# Patient Record
Sex: Female | Born: 1975 | Race: White | Hispanic: No | Marital: Single | Smoking: Current every day smoker
Health system: Southern US, Community
[De-identification: ages and names within clinical notes are randomized; demographics above are authoritative.]

## PROBLEM LIST (undated history)

## (undated) ENCOUNTER — Inpatient Hospital Stay (HOSPITAL_COMMUNITY): Payer: Self-pay

## (undated) DIAGNOSIS — K611 Rectal abscess: Secondary | ICD-10-CM

## (undated) DIAGNOSIS — K649 Unspecified hemorrhoids: Secondary | ICD-10-CM

## (undated) DIAGNOSIS — K645 Perianal venous thrombosis: Secondary | ICD-10-CM

## (undated) DIAGNOSIS — Z720 Tobacco use: Secondary | ICD-10-CM

## (undated) HISTORY — PX: TONSILLECTOMY: SUR1361

## (undated) HISTORY — PX: DILATION AND CURETTAGE OF UTERUS: SHX78

---

## 1998-09-19 ENCOUNTER — Inpatient Hospital Stay (HOSPITAL_COMMUNITY): Admission: EM | Admit: 1998-09-19 | Discharge: 1998-09-20 | Payer: Self-pay | Admitting: Emergency Medicine

## 1998-09-19 ENCOUNTER — Encounter: Payer: Self-pay | Admitting: Emergency Medicine

## 1998-09-24 ENCOUNTER — Inpatient Hospital Stay (HOSPITAL_COMMUNITY): Admission: EM | Admit: 1998-09-24 | Discharge: 1998-09-25 | Payer: Self-pay | Admitting: Family Medicine

## 1998-11-26 ENCOUNTER — Other Ambulatory Visit: Admission: RE | Admit: 1998-11-26 | Discharge: 1998-11-26 | Payer: Self-pay | Admitting: Family Medicine

## 1999-02-06 ENCOUNTER — Emergency Department (HOSPITAL_COMMUNITY): Admission: EM | Admit: 1999-02-06 | Discharge: 1999-02-07 | Payer: Self-pay | Admitting: *Deleted

## 1999-09-23 ENCOUNTER — Other Ambulatory Visit: Admission: RE | Admit: 1999-09-23 | Discharge: 1999-09-23 | Payer: Self-pay | Admitting: Obstetrics and Gynecology

## 2000-12-09 ENCOUNTER — Emergency Department (HOSPITAL_COMMUNITY): Admission: EM | Admit: 2000-12-09 | Discharge: 2000-12-10 | Payer: Self-pay | Admitting: Emergency Medicine

## 2000-12-10 ENCOUNTER — Encounter: Payer: Self-pay | Admitting: Emergency Medicine

## 2001-01-01 ENCOUNTER — Other Ambulatory Visit: Admission: RE | Admit: 2001-01-01 | Discharge: 2001-01-01 | Payer: Self-pay | Admitting: Obstetrics and Gynecology

## 2001-12-18 ENCOUNTER — Encounter: Payer: Self-pay | Admitting: Emergency Medicine

## 2001-12-19 ENCOUNTER — Observation Stay (HOSPITAL_COMMUNITY): Admission: EM | Admit: 2001-12-19 | Discharge: 2001-12-20 | Payer: Self-pay | Admitting: Emergency Medicine

## 2001-12-19 ENCOUNTER — Encounter: Payer: Self-pay | Admitting: Emergency Medicine

## 2002-01-16 ENCOUNTER — Other Ambulatory Visit: Admission: RE | Admit: 2002-01-16 | Discharge: 2002-01-16 | Payer: Self-pay | Admitting: Obstetrics and Gynecology

## 2002-01-25 ENCOUNTER — Ambulatory Visit (HOSPITAL_COMMUNITY): Admission: RE | Admit: 2002-01-25 | Discharge: 2002-01-25 | Payer: Self-pay | Admitting: Obstetrics and Gynecology

## 2002-01-25 ENCOUNTER — Encounter: Payer: Self-pay | Admitting: Obstetrics and Gynecology

## 2002-02-20 ENCOUNTER — Inpatient Hospital Stay (HOSPITAL_COMMUNITY): Admission: AD | Admit: 2002-02-20 | Discharge: 2002-02-20 | Payer: Self-pay | Admitting: Obstetrics and Gynecology

## 2003-04-30 ENCOUNTER — Other Ambulatory Visit: Admission: RE | Admit: 2003-04-30 | Discharge: 2003-04-30 | Payer: Self-pay | Admitting: Obstetrics and Gynecology

## 2003-08-12 ENCOUNTER — Inpatient Hospital Stay (HOSPITAL_COMMUNITY): Admission: AD | Admit: 2003-08-12 | Discharge: 2003-08-23 | Payer: Self-pay | Admitting: Obstetrics and Gynecology

## 2003-10-30 ENCOUNTER — Inpatient Hospital Stay (HOSPITAL_COMMUNITY): Admission: AD | Admit: 2003-10-30 | Discharge: 2003-11-01 | Payer: Self-pay | Admitting: Obstetrics and Gynecology

## 2003-11-17 ENCOUNTER — Emergency Department (HOSPITAL_COMMUNITY): Admission: EM | Admit: 2003-11-17 | Discharge: 2003-11-17 | Payer: Self-pay | Admitting: Emergency Medicine

## 2005-07-18 ENCOUNTER — Ambulatory Visit: Payer: Self-pay | Admitting: Family Medicine

## 2005-08-05 ENCOUNTER — Ambulatory Visit: Payer: Self-pay | Admitting: Family Medicine

## 2006-02-02 ENCOUNTER — Other Ambulatory Visit: Admission: RE | Admit: 2006-02-02 | Discharge: 2006-02-02 | Payer: Self-pay | Admitting: Obstetrics and Gynecology

## 2010-06-06 ENCOUNTER — Encounter: Payer: Self-pay | Admitting: Obstetrics and Gynecology

## 2010-07-25 ENCOUNTER — Inpatient Hospital Stay (INDEPENDENT_AMBULATORY_CARE_PROVIDER_SITE_OTHER)
Admission: RE | Admit: 2010-07-25 | Discharge: 2010-07-25 | Disposition: A | Discharge status: Home / Self Care | Payer: Medicaid Other | Source: Ambulatory Visit | Attending: Family Medicine | Admitting: Family Medicine

## 2010-07-25 DIAGNOSIS — M67919 Unspecified disorder of synovium and tendon, unspecified shoulder: Secondary | ICD-10-CM

## 2010-12-24 ENCOUNTER — Inpatient Hospital Stay (INDEPENDENT_AMBULATORY_CARE_PROVIDER_SITE_OTHER)
Admission: RE | Admit: 2010-12-24 | Discharge: 2010-12-24 | Disposition: A | Discharge status: Home / Self Care | Payer: Medicaid Other | Source: Ambulatory Visit | Attending: Emergency Medicine | Admitting: Emergency Medicine

## 2010-12-24 DIAGNOSIS — L089 Local infection of the skin and subcutaneous tissue, unspecified: Secondary | ICD-10-CM

## 2010-12-24 DIAGNOSIS — L98499 Non-pressure chronic ulcer of skin of other sites with unspecified severity: Secondary | ICD-10-CM

## 2010-12-24 DIAGNOSIS — IMO0001 Reserved for inherently not codable concepts without codable children: Secondary | ICD-10-CM

## 2014-06-16 ENCOUNTER — Emergency Department (HOSPITAL_COMMUNITY)
Admission: EM | Admit: 2014-06-16 | Discharge: 2014-06-16 | Discharge status: Against Medical Advice | Payer: 59 | Attending: Emergency Medicine | Admitting: Emergency Medicine

## 2014-06-16 ENCOUNTER — Ambulatory Visit: Payer: Self-pay

## 2014-06-16 ENCOUNTER — Encounter (HOSPITAL_COMMUNITY): Payer: Self-pay

## 2014-06-16 DIAGNOSIS — M542 Cervicalgia: Secondary | ICD-10-CM | POA: Diagnosis not present

## 2014-06-16 DIAGNOSIS — Z720 Tobacco use: Secondary | ICD-10-CM | POA: Diagnosis not present

## 2014-06-16 DIAGNOSIS — M549 Dorsalgia, unspecified: Secondary | ICD-10-CM | POA: Diagnosis not present

## 2014-06-16 NOTE — ED Notes (Signed)
Patient reports that she was unable to get out of bed due to pain in neck and back.  She reports that she is medicated herself at home with Robaxin and Naproxen, which helped her to move, but still rates pain 8/10.

## 2014-08-19 ENCOUNTER — Encounter (HOSPITAL_COMMUNITY): Payer: Self-pay | Admitting: *Deleted

## 2014-08-19 ENCOUNTER — Inpatient Hospital Stay (HOSPITAL_COMMUNITY): Payer: 59

## 2014-08-19 ENCOUNTER — Inpatient Hospital Stay (HOSPITAL_COMMUNITY)
Admission: AD | Admit: 2014-08-19 | Discharge: 2014-08-19 | Disposition: A | Discharge status: Home / Self Care | Payer: 59 | Source: Ambulatory Visit | Attending: Family Medicine | Admitting: Family Medicine

## 2014-08-19 DIAGNOSIS — O208 Other hemorrhage in early pregnancy: Secondary | ICD-10-CM | POA: Diagnosis not present

## 2014-08-19 DIAGNOSIS — O9989 Other specified diseases and conditions complicating pregnancy, childbirth and the puerperium: Secondary | ICD-10-CM | POA: Diagnosis not present

## 2014-08-19 DIAGNOSIS — R109 Unspecified abdominal pain: Secondary | ICD-10-CM

## 2014-08-19 DIAGNOSIS — R55 Syncope and collapse: Secondary | ICD-10-CM | POA: Insufficient documentation

## 2014-08-19 DIAGNOSIS — Z3A01 Less than 8 weeks gestation of pregnancy: Secondary | ICD-10-CM | POA: Insufficient documentation

## 2014-08-19 DIAGNOSIS — O26899 Other specified pregnancy related conditions, unspecified trimester: Secondary | ICD-10-CM

## 2014-08-19 LAB — URINALYSIS, ROUTINE W REFLEX MICROSCOPIC
BILIRUBIN URINE: NEGATIVE
Glucose, UA: NEGATIVE mg/dL
Hgb urine dipstick: NEGATIVE
Ketones, ur: NEGATIVE mg/dL
Leukocytes, UA: NEGATIVE
Nitrite: NEGATIVE
Protein, ur: NEGATIVE mg/dL
Urobilinogen, UA: 0.2 mg/dL (ref 0.0–1.0)
pH: 5.5 (ref 5.0–8.0)

## 2014-08-19 LAB — CBC
HCT: 33.9 % — ABNORMAL LOW (ref 36.0–46.0)
HEMOGLOBIN: 11.3 g/dL — AB (ref 12.0–15.0)
MCH: 26.7 pg (ref 26.0–34.0)
MCHC: 33.3 g/dL (ref 30.0–36.0)
MCV: 80.1 fL (ref 78.0–100.0)
Platelets: 381 10*3/uL (ref 150–400)
RBC: 4.23 MIL/uL (ref 3.87–5.11)
RDW: 17.6 % — AB (ref 11.5–15.5)
WBC: 10.7 10*3/uL — ABNORMAL HIGH (ref 4.0–10.5)

## 2014-08-19 LAB — WET PREP, GENITAL
Clue Cells Wet Prep HPF POC: NONE SEEN
Trich, Wet Prep: NONE SEEN
Yeast Wet Prep HPF POC: NONE SEEN

## 2014-08-19 LAB — POCT PREGNANCY, URINE: Preg Test, Ur: POSITIVE — AB

## 2014-08-19 LAB — HCG, QUANTITATIVE, PREGNANCY: hCG, Beta Chain, Quant, S: 80124 m[IU]/mL — ABNORMAL HIGH (ref <5)

## 2014-08-19 MED ORDER — PROMETHAZINE HCL 25 MG PO TABS: 25.0000 mg | ORAL_TABLET | Freq: Four times a day (QID) | ORAL | Status: DC | PRN

## 2014-08-19 NOTE — Discharge Instructions (Signed)
First Trimester of Pregnancy The first trimester of pregnancy is from week 1 until the end of week 12 (months 1 through 3). A week after a sperm fertilizes an egg, the egg will implant on the wall of the uterus. This embryo will begin to develop into a baby. Genes from you and your partner are forming the baby. The female genes determine whether the baby is a boy or a girl. At 6-8 weeks, the eyes and face are formed, and the heartbeat can be seen on ultrasound. At the end of 12 weeks, all the baby's organs are formed.  Now that you are pregnant, you will want to do everything you can to have a healthy baby. Two of the most important things are to get good prenatal care and to follow your health care provider's instructions. Prenatal care is all the medical care you receive before the baby's birth. This care will help prevent, find, and treat any problems during the pregnancy and childbirth. BODY CHANGES Your body goes through many changes during pregnancy. The changes vary from woman to woman.   You may gain or lose a couple of pounds at first.  You may feel sick to your stomach (nauseous) and throw up (vomit). If the vomiting is uncontrollable, call your health care provider.  You may tire easily.  You may develop headaches that can be relieved by medicines approved by your health care provider.  You may urinate more often. Painful urination may mean you have a bladder infection.  You may develop heartburn as a result of your pregnancy.  You may develop constipation because certain hormones are causing the muscles that push waste through your intestines to slow down.  You may develop hemorrhoids or swollen, bulging veins (varicose veins).  Your breasts may begin to grow larger and become tender. Your nipples may stick out more, and the tissue that surrounds them (areola) may become darker.  Your gums may bleed and may be sensitive to brushing and flossing.  Dark spots or blotches (chloasma,  mask of pregnancy) may develop on your face. This will likely fade after the baby is born.  Your menstrual periods will stop.  You may have a loss of appetite.  You may develop cravings for certain kinds of food.  You may have changes in your emotions from day to day, such as being excited to be pregnant or being concerned that something may go wrong with the pregnancy and baby.  You may have more vivid and strange dreams.  You may have changes in your hair. These can include thickening of your hair, rapid growth, and changes in texture. Some women also have hair loss during or after pregnancy, or hair that feels dry or thin. Your hair will most likely return to normal after your baby is born. WHAT TO EXPECT AT YOUR PRENATAL VISITS During a routine prenatal visit:  You will be weighed to make sure you and the baby are growing normally.  Your blood pressure will be taken.  Your abdomen will be measured to track your baby's growth.  The fetal heartbeat will be listened to starting around week 10 or 12 of your pregnancy.  Test results from any previous visits will be discussed. Your health care provider may ask you:  How you are feeling.  If you are feeling the baby move.  If you have had any abnormal symptoms, such as leaking fluid, bleeding, severe headaches, or abdominal cramping.  If you have any questions. Other tests   that may be performed during your first trimester include:  Blood tests to find your blood type and to check for the presence of any previous infections. They will also be used to check for low iron levels (anemia) and Rh antibodies. Later in the pregnancy, blood tests for diabetes will be done along with other tests if problems develop.  Urine tests to check for infections, diabetes, or protein in the urine.  An ultrasound to confirm the proper growth and development of the baby.  An amniocentesis to check for possible genetic problems.  Fetal screens for  spina bifida and Down syndrome.  You may need other tests to make sure you and the baby are doing well. HOME CARE INSTRUCTIONS  Medicines  Follow your health care provider's instructions regarding medicine use. Specific medicines may be either safe or unsafe to take during pregnancy.  Take your prenatal vitamins as directed.  If you develop constipation, try taking a stool softener if your health care provider approves. Diet  Eat regular, well-balanced meals. Choose a variety of foods, such as meat or vegetable-based protein, fish, milk and low-fat dairy products, vegetables, fruits, and whole grain breads and cereals. Your health care provider will help you determine the amount of weight gain that is right for you.  Avoid raw meat and uncooked cheese. These carry germs that can cause birth defects in the baby.  Eating four or five small meals rather than three large meals a day may help relieve nausea and vomiting. If you start to feel nauseous, eating a few soda crackers can be helpful. Drinking liquids between meals instead of during meals also seems to help nausea and vomiting.  If you develop constipation, eat more high-fiber foods, such as fresh vegetables or fruit and whole grains. Drink enough fluids to keep your urine clear or pale yellow. Activity and Exercise  Exercise only as directed by your health care provider. Exercising will help you:  Control your weight.  Stay in shape.  Be prepared for labor and delivery.  Experiencing pain or cramping in the lower abdomen or low back is a good sign that you should stop exercising. Check with your health care provider before continuing normal exercises.  Try to avoid standing for long periods of time. Move your legs often if you must stand in one place for a long time.  Avoid heavy lifting.  Wear low-heeled shoes, and practice good posture.  You may continue to have sex unless your health care provider directs you  otherwise. Relief of Pain or Discomfort  Wear a good support bra for breast tenderness.   Take warm sitz baths to soothe any pain or discomfort caused by hemorrhoids. Use hemorrhoid cream if your health care provider approves.   Rest with your legs elevated if you have leg cramps or low back pain.  If you develop varicose veins in your legs, wear support hose. Elevate your feet for 15 minutes, 3-4 times a day. Limit salt in your diet. Prenatal Care  Schedule your prenatal visits by the twelfth week of pregnancy. They are usually scheduled monthly at first, then more often in the last 2 months before delivery.  Write down your questions. Take them to your prenatal visits.  Keep all your prenatal visits as directed by your health care provider. Safety  Wear your seat belt at all times when driving.  Make a list of emergency phone numbers, including numbers for family, friends, the hospital, and police and fire departments. General Tips    Ask your health care provider for a referral to a local prenatal education class. Begin classes no later than at the beginning of month 6 of your pregnancy.  Ask for help if you have counseling or nutritional needs during pregnancy. Your health care provider can offer advice or refer you to specialists for help with various needs.  Do not use hot tubs, steam rooms, or saunas.  Do not douche or use tampons or scented sanitary pads.  Do not cross your legs for long periods of time.  Avoid cat litter boxes and soil used by cats. These carry germs that can cause birth defects in the baby and possibly loss of the fetus by miscarriage or stillbirth.  Avoid all smoking, herbs, alcohol, and medicines not prescribed by your health care provider. Chemicals in these affect the formation and growth of the baby.  Schedule a dentist appointment. At home, brush your teeth with a soft toothbrush and be gentle when you floss. SEEK MEDICAL CARE IF:   You have  dizziness.  You have mild pelvic cramps, pelvic pressure, or nagging pain in the abdominal area.  You have persistent nausea, vomiting, or diarrhea.  You have a bad smelling vaginal discharge.  You have pain with urination.  You notice increased swelling in your face, hands, legs, or ankles. SEEK IMMEDIATE MEDICAL CARE IF:   You have a fever.  You are leaking fluid from your vagina.  You have spotting or bleeding from your vagina.  You have severe abdominal cramping or pain.  You have rapid weight gain or loss.  You vomit blood or material that looks like coffee grounds.  You are exposed to German measles and have never had them.  You are exposed to fifth disease or chickenpox.  You develop a severe headache.  You have shortness of breath.  You have any kind of trauma, such as from a fall or a car accident. Document Released: 04/26/2001 Document Revised: 09/16/2013 Document Reviewed: 03/12/2013 Exit[employer] Patient Information 2015 [employer]. This information is not intended to replace advice given to you by your health care provider. Make sure you discuss any questions you have with your health care provider.  

## 2014-08-19 NOTE — MAU Note (Signed)
Pos HPT 2 weeks ago, lower abd cramping for 4 weeks, denies bleeding.  Nausea & vomiting for the past week, no diarrhea.  Pt states she passed out this morning, woke up in the floor.  Pt has not eaten today, has drank water & coffee this morning which stayed down.

## 2014-08-19 NOTE — MAU Provider Note (Signed)
Chief Complaint: Loss of Consciousness and Abdominal Pain   First Provider Initiated Contact with Patient 08/19/14 1218     SUBJECTIVE HPI: Whitney [name] is a 39 y.o. [id number] at [id number] by LMP who presents [facility] reporting a one month history of cramping and a syncopal episode this morning. She notes that the cramping has been going on consistently for the past month and describes it as a shooting pain in the bilateral pelvic area. She ranks the pain as a 5/10 that sometimes dissipates quickly and sometimes lasts longer as a cramping sensation. She compared the pain to ovulation pain, and stated that she is not taking anything to relieve the pain. She denies any vaginal bleeding or discharge.  Additionally, this morning she had a syncopal episode, +loss of consciousness. She denies any trauma due to falling on an air mattress in the room. She has had syncopal episodes before due to dehydration. She denies having eaten anything today due to nausea, and admits that she has only had a bit of water to drink today. 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  History reviewed. No pertinent past medical history. OB History  Gravida Para Term Preterm AB SAB TAB Ectopic Multiple Living  4 1   2 2    1     # Outcome Date GA Lbr Len/2nd Weight Sex Delivery Anes PTL Lv  4 Current           3 SAB           2 SAB           1 Para              Past Surgical History  Procedure Laterality Date  . Dilation and curettage of uterus    . Tonsillectomy     History   Social History  . Marital Status: Single    Spouse Name: N/A  . Number of Children: N/A  . Years of Education: N/A   Occupational History  . Not on file.   Social History Main Topics  . Smoking status: Current Every Day Smoker -- 1.00 packs/day  . Smokeless tobacco: Not on file  . Alcohol Use: No     Comment: occasionally  . Drug Use: No  . Sexual Activity: Yes    Birth Control/  Protection: None   Other Topics Concern  . Not on file   Social History Narrative   No current facility-administered medications on file prior to encounter.   No current outpatient prescriptions on file prior to encounter.   No Known Allergies  Review of Systems  Constitutional: Negative for fever, chills and malaise/fatigue.  Gastrointestinal: Positive for abdominal pain. Negative for nausea, vomiting, diarrhea and constipation.  Genitourinary: Negative for dysuria.  Neurological: Positive for dizziness (with fainting episodes) and loss of consciousness. Negative for seizures, weakness and headaches.    OBJECTIVE Blood pressure 111/49, pulse 84, temperature 97.9 F (36.6 C), temperature source Oral, resp. rate 18, height 5\' 6"  (1.676 m), weight 68.402 kg (150 lb 12.8 oz), last menstrual period 07/05/2014. GENERAL: Well-developed, well-nourished female in no acute distress.  HEART: RRR, normal S1/S2, no m/r/g RESP: normal effort, CTAB GI: Abdomen soft, non-tender. Positive bowel sounds 4. MS: Nontender, no edema NEURO: Alert and oriented SPECULUM EXAM: NEFG, physiologic discharge, no blood noted, cervix clean BIMANUAL: cervix closed and long; uterus normal size, no adnexal tenderness or masses  LAB RESULTS Results for orders placed or  performed during the hospital encounter of 08/19/14 (from the past 24 hour(s))  Urinalysis, Routine w reflex microscopic     Status: Abnormal   Collection Time: 08/19/14 11:00 AM  Result Value Ref Range   Color, Urine YELLOW YELLOW   APPearance CLEAR CLEAR   Specific Gravity, Urine >1.030 (H) 1.005 - 1.030   pH 5.5 5.0 - 8.0   Glucose, UA NEGATIVE NEGATIVE mg/dL   Hgb urine dipstick NEGATIVE NEGATIVE   Bilirubin Urine NEGATIVE NEGATIVE   Ketones, ur NEGATIVE NEGATIVE mg/dL   Protein, ur NEGATIVE NEGATIVE mg/dL   Urobilinogen, UA 0.2 0.0 - 1.0 mg/dL   Nitrite NEGATIVE NEGATIVE   Leukocytes, UA NEGATIVE NEGATIVE  Pregnancy, urine POC      Status: Abnormal   Collection Time: 08/19/14 11:12 AM  Result Value Ref Range   Preg Test, Ur POSITIVE (A) NEGATIVE  CBC     Status: Abnormal   Collection Time: 08/19/14 12:30 PM  Result Value Ref Range   WBC 10.7 (H) 4.0 - 10.5 K/uL   RBC 4.23 3.87 - 5.11 MIL/uL   Hemoglobin 11.3 (L) 12.0 - 15.0 g/dL   HCT 40.9 (L) 81.1 - 91.4 %   MCV 80.1 78.0 - 100.0 fL   MCH 26.7 26.0 - 34.0 pg   MCHC 33.3 30.0 - 36.0 g/dL   RDW 78.2 (H) 95.6 - 21.3 %   Platelets 381 150 - 400 K/uL    IMAGING No results found.  MAU COURSE Urine preg test CBC U/A G/C and HIV Ultrasound   ASSESSMENT No diagnosis found.  PLAN Evaluate for possible ectopic     Medication List    Notice    You have not been prescribed any medications.     Results for TRIVIA, HEFFELFINGER (MRN [id number]) as of 08/20/2014 17:41  Ref. Range 08/19/2014 12:29  hCG, Beta Chain, Quant, S Latest Range: <5 mIU/mL 80124 (H)   Results for ARLY, SALMINEN (MRN [id number]) as of 08/20/2014 17:41  Ref. Range 08/19/2014 12:40  Yeast Wet Prep HPF POC Latest Range: NONE SEEN  NONE SEEN  Trich, Wet Prep Latest Range: NONE SEEN  NONE SEEN  Clue Cells Wet Prep HPF POC Latest Range: NONE SEEN  NONE SEEN  WBC, Wet Prep HPF POC Latest Range: NONE SEEN  FEW (A)   FINDINGS: Intrauterine gestational sac: Single  Yolk sac: Yes  Embryo: Yes  Cardiac Activity: Yes  Heart Rate: 118 bpm  MSD: mm w d  CRL: 6.3 Mm 6 w 4 d [place] EDC: 04/10/2015  Maternal uterus/adnexae:  Subchorionic hemorrhage: Small  Right ovary: Normal  Left ovary: Normal  Other :None  Free fluid: None  IMPRESSION: 1. Single living intrauterine gestation. The estimated gestational age is 6 weeks and 4 days. 2. Small subchorionic hemorrhage.  A:  SIUP at [redacted]w[redacted]d        No ectopic       Syncope, probably related to early pregnancy and poor intake       Small subchorionic hemorrhage  P:  Discussed findings with patient.  Discussed small SCH usually resolves over time, rarely progresses to abruption       Recommend she start prenatal care soon to follow pregnancy

## 2014-08-20 LAB — GC/CHLAMYDIA PROBE AMP (~~LOC~~) NOT AT ARMC
Chlamydia: NEGATIVE
Neisseria Gonorrhea: NEGATIVE

## 2014-08-20 LAB — HIV ANTIBODY (ROUTINE TESTING W REFLEX): HIV Screen 4th Generation wRfx: NONREACTIVE

## 2014-08-25 ENCOUNTER — Inpatient Hospital Stay (HOSPITAL_COMMUNITY): Payer: 59

## 2014-08-25 ENCOUNTER — Inpatient Hospital Stay (HOSPITAL_COMMUNITY)
Admission: AD | Admit: 2014-08-25 | Discharge: 2014-08-25 | Disposition: A | Discharge status: Home / Self Care | Payer: 59 | Source: Ambulatory Visit | Attending: Obstetrics and Gynecology | Admitting: Obstetrics and Gynecology

## 2014-08-25 DIAGNOSIS — Z3A01 Less than 8 weeks gestation of pregnancy: Secondary | ICD-10-CM | POA: Diagnosis not present

## 2014-08-25 DIAGNOSIS — O4691 Antepartum hemorrhage, unspecified, first trimester: Secondary | ICD-10-CM

## 2014-08-25 DIAGNOSIS — F17200 Nicotine dependence, unspecified, uncomplicated: Secondary | ICD-10-CM | POA: Diagnosis not present

## 2014-08-25 DIAGNOSIS — O209 Hemorrhage in early pregnancy, unspecified: Secondary | ICD-10-CM | POA: Insufficient documentation

## 2014-08-25 DIAGNOSIS — O99331 Smoking (tobacco) complicating pregnancy, first trimester: Secondary | ICD-10-CM | POA: Insufficient documentation

## 2014-08-25 LAB — URINALYSIS, ROUTINE W REFLEX MICROSCOPIC
BILIRUBIN URINE: NEGATIVE
GLUCOSE, UA: NEGATIVE mg/dL
KETONES UR: NEGATIVE mg/dL
LEUKOCYTES UA: NEGATIVE
Nitrite: NEGATIVE
Protein, ur: NEGATIVE mg/dL
Specific Gravity, Urine: 1.025 (ref 1.005–1.030)
Urobilinogen, UA: 0.2 mg/dL (ref 0.0–1.0)
pH: 6 (ref 5.0–8.0)

## 2014-08-25 LAB — ABO/RH: ABO/RH(D): A POS

## 2014-08-25 LAB — URINE MICROSCOPIC-ADD ON

## 2014-08-25 NOTE — MAU Note (Signed)
Pt dx'd with subchorionic hemorrhage last week, started having bleeding this morning after intercourse, passed golf ball size clot.  Continues to have spotting, lower abd cramping.

## 2014-08-25 NOTE — MAU Provider Note (Signed)
  History   G4P1 at 7.[redacted] wks gestation in with light bleeding now but earlier today had bleeding with clots. Was dx last week with sub chorionic bleed. Denies any pain now but is concerned she miscarried with all the clots earlier.  CSN: [id number]  Arrival date and time: 08/25/14 1713   None     Chief Complaint  Patient presents with  . Vaginal Bleeding  . Abdominal Pain   HPI  OB History    Gravida Para Term Preterm AB TAB SAB Ectopic Multiple Living   4 1   2  2   1       No past medical history on file.  Past Surgical History  Procedure Laterality Date  . Dilation and curettage of uterus    . Tonsillectomy      No family history on file.  History  Substance Use Topics  . Smoking status: Current Every Day Smoker -- 1.00 packs/day  . Smokeless tobacco: Not on file  . Alcohol Use: No     Comment: occasionally    Allergies: No Known Allergies  Prescriptions prior to admission  Medication Sig Dispense Refill Last Dose  . promethazine (PHENERGAN) 25 MG tablet Take 1 tablet (25 mg total) by mouth every 6 (six) hours as needed for nausea or vomiting. 30 tablet 2     Review of Systems  HENT: Negative.   Eyes: Negative.   Respiratory: Negative.   Cardiovascular: Negative.   Gastrointestinal: Negative.   Genitourinary: Negative.        Scant amt vag bleeding  Musculoskeletal: Negative.   Skin: Negative.   Neurological: Negative.   Endo/Heme/Allergies: Negative.   Psychiatric/Behavioral: Negative.    Physical Exam   Blood pressure 110/65, pulse 102, temperature 98 F (36.7 C), temperature source Oral, resp. rate 18, last menstrual period 07/05/2014.  Physical Exam  Constitutional: She is oriented to person, place, and time. She appears well-developed and well-nourished.  HENT:  Head: Normocephalic.  Neck: Normal range of motion.  Cardiovascular: Normal rate, regular rhythm, normal heart sounds and intact distal pulses.   Respiratory: Effort normal and  breath sounds normal.  GI: Soft. Bowel sounds are normal.  Genitourinary:  scant amt of vag bleeding  Musculoskeletal: Normal range of motion.  Neurological: She is alert and oriented to person, place, and time. She has normal reflexes.  Skin: Skin is warm and dry.  Psychiatric: She has a normal mood and affect. Her behavior is normal. Judgment and thought content normal.    MAU Course  Procedures  MDM Vag probe u/s and ABO and RH   Assessment and Plan  VSS, only bleeding when she wipes now, no cramping. Will get vag probe u/s and ABO and RH. U/S shows viable IUP. Will D/C home  Wyvonnia DuskyLAWSON, MARIE DARLENE 08/25/2014, 6:04 PM

## 2015-04-13 ENCOUNTER — Inpatient Hospital Stay (HOSPITAL_COMMUNITY): Payer: Medicaid Other

## 2015-04-13 ENCOUNTER — Observation Stay (HOSPITAL_COMMUNITY): Payer: Medicaid Other | Admitting: Anesthesiology

## 2015-04-13 ENCOUNTER — Encounter (HOSPITAL_COMMUNITY): Payer: Self-pay | Admitting: *Deleted

## 2015-04-13 ENCOUNTER — Observation Stay (HOSPITAL_COMMUNITY)
Admission: AD | Admit: 2015-04-13 | Discharge: 2015-04-14 | Disposition: A | Discharge status: Home / Self Care | Payer: Medicaid Other | Source: Ambulatory Visit | Attending: General Surgery | Admitting: General Surgery

## 2015-04-13 ENCOUNTER — Encounter (HOSPITAL_COMMUNITY)
Admission: AD | Disposition: A | Discharge status: Home / Self Care | Payer: Self-pay | Source: Ambulatory Visit | Attending: Emergency Medicine

## 2015-04-13 DIAGNOSIS — D72829 Elevated white blood cell count, unspecified: Secondary | ICD-10-CM

## 2015-04-13 DIAGNOSIS — K611 Rectal abscess: Secondary | ICD-10-CM | POA: Diagnosis not present

## 2015-04-13 DIAGNOSIS — F17210 Nicotine dependence, cigarettes, uncomplicated: Secondary | ICD-10-CM | POA: Insufficient documentation

## 2015-04-13 DIAGNOSIS — K610 Anal abscess: Secondary | ICD-10-CM

## 2015-04-13 DIAGNOSIS — K645 Perianal venous thrombosis: Principal | ICD-10-CM

## 2015-04-13 DIAGNOSIS — Z720 Tobacco use: Secondary | ICD-10-CM

## 2015-04-13 HISTORY — DX: Rectal abscess: K61.1

## 2015-04-13 HISTORY — PX: INCISION AND DRAINAGE PERIRECTAL ABSCESS: SHX1804

## 2015-04-13 HISTORY — DX: Tobacco use: Z72.0

## 2015-04-13 HISTORY — DX: Perianal venous thrombosis: K64.5

## 2015-04-13 HISTORY — DX: Unspecified hemorrhoids: K64.9

## 2015-04-13 LAB — COMPREHENSIVE METABOLIC PANEL
ALT: 13 U/L — AB (ref 14–54)
AST: 12 U/L — AB (ref 15–41)
Albumin: 3.8 g/dL (ref 3.5–5.0)
Alkaline Phosphatase: 47 U/L (ref 38–126)
Anion gap: 5 (ref 5–15)
BUN: 10 mg/dL (ref 6–20)
CHLORIDE: 108 mmol/L (ref 101–111)
CO2: 22 mmol/L (ref 22–32)
CREATININE: 0.52 mg/dL (ref 0.44–1.00)
Calcium: 9 mg/dL (ref 8.9–10.3)
GFR calc Af Amer: >60 mL/min (ref >60)
Glucose, Bld: 98 mg/dL (ref 65–99)
POTASSIUM: 4.6 mmol/L (ref 3.5–5.1)
SODIUM: 135 mmol/L (ref 135–145)
Total Bilirubin: 0.3 mg/dL (ref 0.3–1.2)
Total Protein: 6.6 g/dL (ref 6.5–8.1)

## 2015-04-13 LAB — WET PREP, GENITAL
SPERM: NONE SEEN
Trich, Wet Prep: NONE SEEN
Yeast Wet Prep HPF POC: NONE SEEN

## 2015-04-13 LAB — CBC
HEMATOCRIT: 34.6 % — AB (ref 36.0–46.0)
HEMOGLOBIN: 11.3 g/dL — AB (ref 12.0–15.0)
MCH: 27.5 pg (ref 26.0–34.0)
MCHC: 32.7 g/dL (ref 30.0–36.0)
MCV: 84.2 fL (ref 78.0–100.0)
Platelets: 395 10*3/uL (ref 150–400)
RBC: 4.11 MIL/uL (ref 3.87–5.11)
RDW: 16.5 % — ABNORMAL HIGH (ref 11.5–15.5)
WBC: 17.2 10*3/uL — ABNORMAL HIGH (ref 4.0–10.5)

## 2015-04-13 LAB — URINALYSIS, ROUTINE W REFLEX MICROSCOPIC
Bilirubin Urine: NEGATIVE
Glucose, UA: NEGATIVE mg/dL
HGB URINE DIPSTICK: NEGATIVE
Ketones, ur: NEGATIVE mg/dL
Leukocytes, UA: NEGATIVE
Nitrite: NEGATIVE
PH: 5.5 (ref 5.0–8.0)
Protein, ur: NEGATIVE mg/dL
SPECIFIC GRAVITY, URINE: 1.015 (ref 1.005–1.030)

## 2015-04-13 LAB — SURGICAL PCR SCREEN
MRSA, PCR: NEGATIVE
Staphylococcus aureus: NEGATIVE

## 2015-04-13 LAB — POCT PREGNANCY, URINE: Preg Test, Ur: NEGATIVE

## 2015-04-13 SURGERY — INCISION AND DRAINAGE, ABSCESS, PERIRECTAL
Anesthesia: General

## 2015-04-13 MED ORDER — DIPHENHYDRAMINE HCL 50 MG/ML IJ SOLN
12.5000 mg | Freq: Four times a day (QID) | INTRAMUSCULAR | Status: DC | PRN
  Administered 2015-04-14: 25 mg via INTRAVENOUS
  Filled 2015-04-13: qty 1

## 2015-04-13 MED ORDER — BUPIVACAINE-EPINEPHRINE 0.25% -1:200000 IJ SOLN
INTRAMUSCULAR | Status: AC
  Filled 2015-04-13: qty 1

## 2015-04-13 MED ORDER — ONDANSETRON 4 MG PO TBDP: 4.0000 mg | ORAL_TABLET | Freq: Four times a day (QID) | ORAL | Status: DC | PRN

## 2015-04-13 MED ORDER — BUPIVACAINE-EPINEPHRINE 0.25% -1:200000 IJ SOLN
INTRAMUSCULAR | Status: DC | PRN
  Administered 2015-04-13: 50 mL

## 2015-04-13 MED ORDER — DIBUCAINE 1 % RE OINT
TOPICAL_OINTMENT | RECTAL | Status: DC | PRN
  Administered 2015-04-13: 1 via RECTAL

## 2015-04-13 MED ORDER — PIPERACILLIN-TAZOBACTAM 3.375 G IVPB
INTRAVENOUS | Status: AC
  Filled 2015-04-13: qty 50

## 2015-04-13 MED ORDER — LIDOCAINE HCL (CARDIAC) 20 MG/ML IV SOLN
INTRAVENOUS | Status: DC | PRN
  Administered 2015-04-13: 30 mg via INTRAVENOUS

## 2015-04-13 MED ORDER — IOHEXOL 300 MG/ML  SOLN
50.0000 mL | Freq: Once | INTRAMUSCULAR | Status: AC | PRN
  Administered 2015-04-13: 50 mL via ORAL

## 2015-04-13 MED ORDER — DEXAMETHASONE SODIUM PHOSPHATE 10 MG/ML IJ SOLN
INTRAMUSCULAR | Status: AC
  Filled 2015-04-13: qty 1

## 2015-04-13 MED ORDER — IOHEXOL 300 MG/ML  SOLN
100.0000 mL | Freq: Once | INTRAMUSCULAR | Status: AC | PRN
  Administered 2015-04-13: 100 mL via INTRAVENOUS

## 2015-04-13 MED ORDER — MAGIC MOUTHWASH
15.0000 mL | Freq: Four times a day (QID) | ORAL | Status: DC | PRN
  Filled 2015-04-13: qty 15

## 2015-04-13 MED ORDER — PROMETHAZINE HCL 25 MG/ML IJ SOLN: 6.2500 mg | INTRAMUSCULAR | Status: DC | PRN

## 2015-04-13 MED ORDER — ONDANSETRON HCL 4 MG/2ML IJ SOLN: 4.0000 mg | Freq: Four times a day (QID) | INTRAMUSCULAR | Status: DC | PRN

## 2015-04-13 MED ORDER — ALUM & MAG HYDROXIDE-SIMETH 200-200-20 MG/5ML PO SUSP: 30.0000 mL | Freq: Four times a day (QID) | ORAL | Status: DC | PRN

## 2015-04-13 MED ORDER — ACETAMINOPHEN 500 MG PO TABS
1000.0000 mg | ORAL_TABLET | Freq: Three times a day (TID) | ORAL | Status: DC
  Administered 2015-04-13 – 2015-04-14 (×2): 1000 mg via ORAL
  Filled 2015-04-13 (×3): qty 2

## 2015-04-13 MED ORDER — SODIUM CHLORIDE 0.9 % IJ SOLN: 3.0000 mL | Freq: Two times a day (BID) | INTRAMUSCULAR | Status: DC

## 2015-04-13 MED ORDER — AMOXICILLIN-POT CLAVULANATE 875-125 MG PO TABS: 1.0000 | ORAL_TABLET | Freq: Two times a day (BID) | ORAL | Status: AC

## 2015-04-13 MED ORDER — LIDOCAINE HCL (CARDIAC) 20 MG/ML IV SOLN
INTRAVENOUS | Status: AC
  Filled 2015-04-13: qty 5

## 2015-04-13 MED ORDER — PHENOL 1.4 % MT LIQD
2.0000 | OROMUCOSAL | Status: DC | PRN
  Filled 2015-04-13: qty 177

## 2015-04-13 MED ORDER — MORPHINE SULFATE (PF) 2 MG/ML IV SOLN
1.0000 mg | INTRAVENOUS | Status: DC | PRN
Start: 2015-04-13 — End: 2015-04-14
  Administered 2015-04-13: 2 mg via INTRAVENOUS
  Filled 2015-04-13: qty 1

## 2015-04-13 MED ORDER — ONDANSETRON HCL 4 MG/2ML IJ SOLN
INTRAMUSCULAR | Status: AC
  Filled 2015-04-13: qty 2

## 2015-04-13 MED ORDER — PROPOFOL 10 MG/ML IV BOLUS
INTRAVENOUS | Status: DC | PRN
  Administered 2015-04-13: 150 mg via INTRAVENOUS
  Administered 2015-04-13: 50 mg via INTRAVENOUS

## 2015-04-13 MED ORDER — LIP MEDEX EX OINT
1.0000 "application " | TOPICAL_OINTMENT | Freq: Two times a day (BID) | CUTANEOUS | Status: DC
  Administered 2015-04-13 – 2015-04-14 (×2): 1 via TOPICAL
  Filled 2015-04-13: qty 7

## 2015-04-13 MED ORDER — MENTHOL 3 MG MT LOZG
1.0000 | LOZENGE | OROMUCOSAL | Status: DC | PRN
  Filled 2015-04-13: qty 9

## 2015-04-13 MED ORDER — ONDANSETRON HCL 4 MG/2ML IJ SOLN
INTRAMUSCULAR | Status: DC | PRN
  Administered 2015-04-13: 4 mg via INTRAVENOUS

## 2015-04-13 MED ORDER — FENTANYL CITRATE (PF) 100 MCG/2ML IJ SOLN
INTRAMUSCULAR | Status: AC
  Filled 2015-04-13: qty 2

## 2015-04-13 MED ORDER — WITCH HAZEL-GLYCERIN EX PADS
1.0000 | MEDICATED_PAD | CUTANEOUS | Status: DC | PRN
Start: 2015-04-13 — End: 2015-04-14
  Filled 2015-04-13: qty 100

## 2015-04-13 MED ORDER — PROPOFOL 10 MG/ML IV BOLUS
INTRAVENOUS | Status: AC
  Filled 2015-04-13: qty 20

## 2015-04-13 MED ORDER — KETOROLAC TROMETHAMINE 60 MG/2ML IM SOLN
60.0000 mg | Freq: Once | INTRAMUSCULAR | Status: AC
  Administered 2015-04-13: 60 mg via INTRAMUSCULAR
  Filled 2015-04-13: qty 2

## 2015-04-13 MED ORDER — ONDANSETRON HCL 40 MG/20ML IJ SOLN
8.0000 mg | Freq: Four times a day (QID) | INTRAMUSCULAR | Status: DC | PRN
  Filled 2015-04-13: qty 4

## 2015-04-13 MED ORDER — LACTATED RINGERS IV BOLUS (SEPSIS): 1000.0000 mL | Freq: Three times a day (TID) | INTRAVENOUS | Status: DC | PRN

## 2015-04-13 MED ORDER — MIDAZOLAM HCL 5 MG/5ML IJ SOLN
INTRAMUSCULAR | Status: DC | PRN
  Administered 2015-04-13: 2 mg via INTRAVENOUS

## 2015-04-13 MED ORDER — MIDAZOLAM HCL 2 MG/2ML IJ SOLN
INTRAMUSCULAR | Status: AC
  Filled 2015-04-13: qty 2

## 2015-04-13 MED ORDER — NICOTINE 21 MG/24HR TD PT24
21.0000 mg | MEDICATED_PATCH | Freq: Every day | TRANSDERMAL | Status: DC
  Administered 2015-04-13: 21 mg via TRANSDERMAL
  Filled 2015-04-13 (×2): qty 1

## 2015-04-13 MED ORDER — SODIUM CHLORIDE 0.9 % IV SOLN: 250.0000 mL | INTRAVENOUS | Status: DC | PRN

## 2015-04-13 MED ORDER — ACETAMINOPHEN 325 MG PO TABS: 650.0000 mg | ORAL_TABLET | Freq: Four times a day (QID) | ORAL | Status: DC | PRN

## 2015-04-13 MED ORDER — FENTANYL CITRATE (PF) 100 MCG/2ML IJ SOLN
INTRAMUSCULAR | Status: DC | PRN
  Administered 2015-04-13 (×4): 50 ug via INTRAVENOUS

## 2015-04-13 MED ORDER — METOPROLOL TARTRATE 1 MG/ML IV SOLN
5.0000 mg | Freq: Four times a day (QID) | INTRAVENOUS | Status: DC | PRN
  Filled 2015-04-13: qty 5

## 2015-04-13 MED ORDER — OXYCODONE HCL 5 MG PO TABS
5.0000 mg | ORAL_TABLET | ORAL | Status: DC | PRN
  Administered 2015-04-13 – 2015-04-14 (×2): 10 mg via ORAL
  Filled 2015-04-13 (×2): qty 2

## 2015-04-13 MED ORDER — NAPROXEN 500 MG PO TABS: 500.0000 mg | ORAL_TABLET | Freq: Two times a day (BID) | ORAL | Status: AC

## 2015-04-13 MED ORDER — DEXAMETHASONE SODIUM PHOSPHATE 10 MG/ML IJ SOLN
INTRAMUSCULAR | Status: DC | PRN
  Administered 2015-04-13: 10 mg via INTRAVENOUS

## 2015-04-13 MED ORDER — LACTATED RINGERS IV SOLN
INTRAVENOUS | Status: DC | PRN
  Administered 2015-04-13: 19:00:00 via INTRAVENOUS

## 2015-04-13 MED ORDER — DIBUCAINE 1 % RE OINT
TOPICAL_OINTMENT | RECTAL | Status: AC
  Filled 2015-04-13: qty 28

## 2015-04-13 MED ORDER — ONDANSETRON HCL 4 MG/2ML IJ SOLN: 4.0000 mg | INTRAMUSCULAR | Status: DC | PRN

## 2015-04-13 MED ORDER — SODIUM CHLORIDE 0.9 % IJ SOLN: 3.0000 mL | INTRAMUSCULAR | Status: DC | PRN

## 2015-04-13 MED ORDER — OXYCODONE HCL 5 MG PO TABS: 5.0000 mg | ORAL_TABLET | Freq: Four times a day (QID) | ORAL | Status: DC | PRN

## 2015-04-13 MED ORDER — PIPERACILLIN-TAZOBACTAM 3.375 G IVPB
3.3750 g | Freq: Three times a day (TID) | INTRAVENOUS | Status: DC
  Administered 2015-04-13 – 2015-04-14 (×3): 3.375 g via INTRAVENOUS
  Filled 2015-04-13 (×4): qty 50

## 2015-04-13 MED ORDER — HYDROMORPHONE HCL 1 MG/ML IJ SOLN: 0.5000 mg | INTRAMUSCULAR | Status: DC | PRN

## 2015-04-13 MED ORDER — LACTATED RINGERS IV SOLN
INTRAVENOUS | Status: DC
  Administered 2015-04-14: 07:00:00 via INTRAVENOUS

## 2015-04-13 MED ORDER — HYDROCODONE-ACETAMINOPHEN 5-325 MG PO TABS
1.0000 | ORAL_TABLET | Freq: Once | ORAL | Status: AC
  Administered 2015-04-13: 1 via ORAL
  Filled 2015-04-13: qty 1

## 2015-04-13 MED ORDER — IBUPROFEN 200 MG PO TABS: 400.0000 mg | ORAL_TABLET | Freq: Four times a day (QID) | ORAL | Status: DC | PRN

## 2015-04-13 MED ORDER — METHOCARBAMOL 1000 MG/10ML IJ SOLN
1000.0000 mg | Freq: Four times a day (QID) | INTRAVENOUS | Status: DC | PRN
  Filled 2015-04-13: qty 10

## 2015-04-13 MED ORDER — LORAZEPAM 2 MG/ML IJ SOLN: 0.5000 mg | Freq: Four times a day (QID) | INTRAMUSCULAR | Status: DC | PRN

## 2015-04-13 MED ORDER — HYDROMORPHONE HCL 1 MG/ML IJ SOLN: 0.2500 mg | INTRAMUSCULAR | Status: DC | PRN

## 2015-04-13 SURGICAL SUPPLY — 28 items
BLADE HEX COATED 2.75 (ELECTRODE) ×3 IMPLANT
BLADE SURG 15 STRL LF DISP TIS (BLADE) ×1 IMPLANT
BLADE SURG 15 STRL SS (BLADE) ×3
BRIEF STRETCH FOR OB PAD LRG (UNDERPADS AND DIAPERS) ×3 IMPLANT
DECANTER SPIKE VIAL GLASS SM (MISCELLANEOUS) ×2 IMPLANT
DRAPE LAPAROTOMY T 102X78X121 (DRAPES) ×3 IMPLANT
DRSG PAD ABDOMINAL 8X10 ST (GAUZE/BANDAGES/DRESSINGS) ×3 IMPLANT
ELECT PENCIL ROCKER SW 15FT (MISCELLANEOUS) ×3 IMPLANT
ELECT REM PT RETURN 9FT ADLT (ELECTROSURGICAL) ×3
ELECTRODE REM PT RTRN 9FT ADLT (ELECTROSURGICAL) ×1 IMPLANT
GAUZE SPONGE 4X4 12PLY STRL (GAUZE/BANDAGES/DRESSINGS) ×3 IMPLANT
GAUZE SPONGE 4X4 16PLY XRAY LF (GAUZE/BANDAGES/DRESSINGS) ×3 IMPLANT
GLOVE ECLIPSE 8.0 STRL XLNG CF (GLOVE) ×3 IMPLANT
GLOVE INDICATOR 8.0 STRL GRN (GLOVE) ×3 IMPLANT
GOWN STRL REUS W/TWL XL LVL3 (GOWN DISPOSABLE) ×6 IMPLANT
KIT BASIN OR (CUSTOM PROCEDURE TRAY) ×3 IMPLANT
LUBRICANT JELLY K Y 4OZ (MISCELLANEOUS) ×3 IMPLANT
NDL SAFETY ECLIPSE 18X1.5 (NEEDLE) IMPLANT
NEEDLE HYPO 18GX1.5 SHARP (NEEDLE) ×3
NEEDLE HYPO 22GX1.5 SAFETY (NEEDLE) ×3 IMPLANT
PACK BASIC VI WITH GOWN DISP (CUSTOM PROCEDURE TRAY) ×3 IMPLANT
SPONGE LAP 18X18 X RAY DECT (DISPOSABLE) ×1 IMPLANT
SUT CHROMIC 3 0 SH 27 (SUTURE) ×2 IMPLANT
SYR 20CC LL (SYRINGE) ×5 IMPLANT
SYR BULB IRRIGATION 50ML (SYRINGE) ×3 IMPLANT
TOWEL OR 17X26 10 PK STRL BLUE (TOWEL DISPOSABLE) ×3 IMPLANT
TOWEL OR NON WOVEN STRL DISP B (DISPOSABLE) ×1 IMPLANT
YANKAUER SUCT BULB TIP 10FT TU (MISCELLANEOUS) ×3 IMPLANT

## 2015-04-13 NOTE — ED Provider Notes (Signed)
CSN: [id number]     Arrival date & time 04/13/15  8756 History   First MD Initiated Contact with Patient 04/13/15 1527     Chief Complaint  Patient presents with  . perineal swelling      (Consider location/radiation/quality/duration/timing/severity/associated sxs/prior Treatment) HPI  Whitney Whitney [name] is a 39 y.o. female  with a PMH of hemorrhoids who [facility] with likely perianal abscess shown on CT. Labs [facility] at [facility] which are significant for increased white count of 17.2. Pt. States that pain began acutely yesterday and pain has gotten worse. Denies fever.   Past Medical History  Diagnosis Date  . Hemorrhoids     history of    Past Surgical History  Procedure Laterality Date  . Dilation and curettage of uterus    . Tonsillectomy     No family history on file. Social History  Substance Use Topics  . Smoking status: Current Every Day Smoker -- 1.00 packs/day  . Smokeless tobacco: None  . Alcohol Use: No     Comment: occasionally   OB History    Gravida Para Term Preterm AB TAB SAB Ectopic Multiple Living   Review of Systems  Constitutional: Negative.   HENT: Negative for congestion, rhinorrhea and sore throat.   Eyes: Negative for visual disturbance.  Respiratory: Negative for cough, shortness of breath and wheezing.   Cardiovascular: Negative.   Gastrointestinal: Negative for nausea, vomiting and abdominal pain.  Genitourinary: Positive for vaginal pain. Negative for vaginal bleeding and vaginal discharge.  Musculoskeletal: Negative for myalgias, Whitney [name] pain, arthralgias and neck pain.  Skin: Negative for rash.  Neurological: Negative for dizziness, weakness and headaches.      Allergies  Review of patient's allergies indicates no known allergies.  Home Medications   Prior to Admission medications   Medication Sig Start Date End Date Taking? Authorizing Provider  ibuprofen (ADVIL,MOTRIN) 800  MG tablet Take 800 mg by mouth every 8 (eight) hours as needed for moderate pain.   Yes Historical Provider, MD  OVER THE COUNTER MEDICATION Take 1 tablet by mouth daily. Patient takes over the counter Vitamin D   Yes Historical Provider, MD  vitamin C (ASCORBIC ACID) 500 MG tablet Take 500 mg by mouth.   Yes Historical Provider, MD  promethazine (PHENERGAN) 25 MG tablet Take 1 tablet (25 mg total) by mouth every 6 (six) hours as needed for nausea or vomiting. Patient not taking: Reported on 04/13/2015 08/19/14   Aviva Signs, CNM   BP 109/71 mmHg  Pulse 102  Temp(Src) 97.8 F (36.6 C) (Oral)  Resp 18  Wt 70.761 kg  LMP 03/24/2015  Breastfeeding? Unknown Physical Exam  Constitutional: She is oriented to person, place, and time. She appears well-developed and well-nourished.  Alert and in no acute distress  HENT:  Head: Normocephalic and atraumatic.  Cardiovascular: Normal rate, regular rhythm, normal heart sounds and intact distal pulses.  Exam reveals no gallop and no friction rub.   No murmur heard. Pulmonary/Chest: Effort normal and breath sounds normal. No respiratory distress. She has no wheezes. She has no rales. She exhibits no tenderness.  Abdominal: She exhibits no mass. There is no rebound and no guarding.  Abdomen soft, non-tender, non-distended Bowel sounds positive in all four quadrants  Musculoskeletal: She exhibits no edema.  Neurological: She is alert and oriented to person, place, and time.  Skin: Skin is  warm and dry. No rash noted.  Nursing note and vitals reviewed.   ED Course  Procedures (including critical care time) Labs Review Labs Reviewed  WET PREP, GENITAL - Abnormal; Notable for the following:    Clue Cells Wet Prep HPF POC FEW (*)    WBC, Wet Prep HPF POC FEW (*)    All other components within normal limits  CBC - Abnormal; Notable for the following:    WBC 17.2 (*)    Hemoglobin 11.3 (*)    HCT 34.6 (*)    RDW 16.5 (*)    All other  components within normal limits  COMPREHENSIVE METABOLIC PANEL - Abnormal; Notable for the following:    AST 12 (*)    ALT 13 (*)    All other components within normal limits  URINALYSIS, ROUTINE W REFLEX MICROSCOPIC (NOT AT [facility])  POCT PREGNANCY, URINE  GC/CHLAMYDIA PROBE AMP ([place]) NOT AT [facility]    Imaging Review Ct Pelvis W Contrast  04/13/2015  CLINICAL DATA:  Rectal and vaginal pain beginning yesterday. Leukocytosis. EXAM: CT PELVIS WITH CONTRAST TECHNIQUE: Multidetector CT imaging of the pelvis was performed using the standard protocol following the bolus administration of intravenous contrast. CONTRAST:  100mL OMNIPAQUE IOHEXOL 300 MG/ML SOLN, 50mL OMNIPAQUE IOHEXOL 300 MG/ML SOLN COMPARISON:  None. FINDINGS: Uterus and adnexal regions are unremarkable in appearance. No intrapelvic soft tissue masses or lymphadenopathy identified. A 1.6 cm rim enhancing fluid collection is seen between the anus and distal vagina on image 46/series 2. This could represent a small perianal abscess or vaginal/ Bartholin's gland cyst or abscess. IMPRESSION: 1.6 cm rim enhancing fluid collection between the anus and distal vagina. Differential diagnosis includes perianal abscess and vaginal/ Bartholin's gland cyst or abscess. Consider perianal fistula protocol pelvic MRI without and with contrast for further evaluation. Electronically Signed   By: Myles RosenthalJohn  Stahl M.D.   On: 04/13/2015 12:02   I have personally reviewed and evaluated these images and lab results as part of my medical decision-making.   EKG Interpretation None      MDM   Final diagnoses:  Perianal abscess  Elevated WBC count   Whitney Whitney [name] [facility] for likely perianal abscess. Labs and imaging have been completed at [facility] and were reviewed here as well. Patient states pain is controlled at this time. General surgery consulted who will admit.     [facility] RanchoJaime Pilcher Yoshika Vensel, PA-C 04/13/15 1642  Arby BarretteMarcy Pfeiffer, MD 04/15/15  1329

## 2015-04-13 NOTE — Anesthesia Procedure Notes (Signed)
Procedure Name: LMA Insertion Date/Time: 04/13/2015 7:42 PM Performed by: Early OsmondEARGLE, Deseray Daponte E Pre-anesthesia Checklist: Patient identified, Emergency Drugs available, Suction available and Patient being monitored Patient Re-evaluated:Patient Re-evaluated prior to inductionOxygen Delivery Method: Circle system utilized Preoxygenation: Pre-oxygenation with 100% oxygen Intubation Type: IV induction Ventilation: Mask ventilation without difficulty LMA: LMA inserted LMA Size: 4.0 Number of attempts: 1 Placement Confirmation: positive ETCO2,  CO2 detector and breath sounds checked- equal and bilateral Dental Injury: Teeth and Oropharynx as per pre-operative assessment

## 2015-04-13 NOTE — Discharge Instructions (Signed)
ANORECTAL SURGERY:  POST OPERATIVE INSTRUCTIONS  1. Take your usually prescribed home medications unless otherwise directed. 2. DIET: Follow a light bland diet the first 24 hours after arrival home, such as soup, liquids, crackers, etc.  Be sure to include lots of fluids daily.  Avoid fast food or heavy meals as your are more likely to get nauseated.  Eat a low fat the next few days after surgery.   3. PAIN CONTROL: a. Pain is best controlled by a usual combination of three different methods TOGETHER: i. Ice/Heat ii. Over the counter pain medication iii. Prescription pain medication b. Most patients will experience some swelling and discomfort in the anus/rectal area. and incisions.  Ice packs or heat (30-60 minutes up to 6 times a day) will help. Use ice for the first few days to help decrease swelling and bruising, then switch to heat such as warm towels, sitz baths, warm baths, etc to help relax tight/sore spots and speed recovery.  Some people prefer to use ice alone, heat alone, alternating between ice & heat.  Experiment to what works for you.  Swelling and bruising can take several weeks to resolve.   c. It is helpful to take an over-the-counter pain medication regularly for the first few weeks.  Choose one of the following that works best for you: i. Naproxen (Aleve, etc)  Two 232m tabs twice a day ii. Ibuprofen (Advil, etc) Three 2017mtabs four times a day (every meal & bedtime) iii. Acetaminophen (Tylenol, etc) 500-65058mour times a day (every meal & bedtime) d. A  prescription for pain medication (such as oxycodone, hydrocodone, etc) should be given to you upon discharge.  Take your pain medication as prescribed.  i. If you are having problems/concerns with the prescription medicine (does not control pain, nausea, vomiting, rash, itching, etc), please call us [phone] see if we need to switch you to a different pain medicine that will work better for you and/or control your  side effect better. ii. If you need a refill on your pain medication, please contact your pharmacy.  They will contact our office to request authorization. Prescriptions will not be filled after 5 pm or on week-ends.  Use a Sitz Bath 4-8 times a day for relief   [employer]itz bath is a warm water bath taken in the sitting position that covers only the hips and buttocks. It may be used for either healing or hygiene purposes. Sitz baths are also used to relieve pain, itching, or muscle spasms. The water may contain medicine. Moist heat will help you heal and relax.  HOME CARE INSTRUCTIONS  Take 3 to 4 sitz baths a day. 1. Fill the bathtub half full with warm water. 2. Sit in the water and open the drain a little. 3. Turn on the warm water to keep the tub half full. Keep the water running constantly. 4. Soak in the water for 15 to 20 minutes. 5. After the sitz bath, pat the affected area dry first.   4. KEEP YOUR BOWELS REGULAR a. The goal is one bowel movement a day b. Avoid getting constipated.  Between the surgery and the pain medications, it is common to experience some constipation.  Increasing fluid intake and taking a fiber supplement (such as Metamucil, Citrucel, FiberCon, MiraLax, etc) 1-2 times a day regularly will usually help prevent this problem from occurring.  A mild laxative (prune juice, Milk of Magnesia, MiraLax, etc) should be taken according to package  directions if there are no bowel movements after 48 hours. °c. Watch out for diarrhea.  If you have many loose bowel movements, simplify your diet to bland foods & liquids for a few days.  Stop any stool softeners and decrease your fiber supplement.  Switching to mild anti-diarrheal medications (Kayopectate, Pepto Bismol) can help.  If this worsens or does not improve, please call us. ° °5. Wound Care °a. Remove your bandages the day after surgery.  Unless discharge instructions indicate otherwise, leave your bandage dry and in  place overnight.  Remove the bandage during your first bowel movement.   °b. Allow the wound packing to fall out over the next few days.  You can trim exposed gauze / ribbon as it falls out.  You do not need to repack the wound unless instructed otherwise.  Wear an absorbent pad or soft cotton gauze in your underwear as needed to catch any drainage and help keep the area  °c. Keep the area clean and dry.  Bathe / shower every day.  Keep the area clean by showering / bathing over the incision / wound.   It is okay to soak an open wound to help wash it.  Wet wipes or showers / gentle washing after bowel movements is often less traumatic than regular toilet paper. °d. You may have some styrofoam-like soft packing in the rectum which will come out with the first bowel movement.  °e. You will often notice bleeding with bowel movements.  This should slow down by the end of the first week of surgery °f. Expect some drainage.  This should slow down, too, by the end of the first week of surgery.  Wear an absorbent pad or soft cotton gauze in your underwear until the drainage stops. °6. ACTIVITIES as tolerated:   °a. You may resume regular (light) daily activities beginning the next day--such as daily self-care, walking, climbing stairs--gradually increasing activities as tolerated.  If you can walk 30 minutes without difficulty, it is safe to try more intense activity such as jogging, treadmill, bicycling, low-impact aerobics, swimming, etc. °b. Save the most intensive and strenuous activity for last such as sit-ups, heavy lifting, contact sports, etc  Refrain from any heavy lifting or straining until you are off narcotics for pain control.   °c. DO NOT PUSH THROUGH PAIN.  Let pain be your guide: If it hurts to do something, don't do it.  Pain is your body warning you to avoid that activity for another week until the pain goes down. °d. You may drive when you are no longer taking prescription pain medication, you can  comfortably sit for long periods of time, and you can safely maneuver your car and apply brakes. °e. You may have sexual intercourse when it is comfortable.  °7. FOLLOW UP in our office °a. Please call [facility] at [phone] to set up an appointment to see your surgeon in the office for a follow-up appointment approximately 2 weeks after your surgery. °b. Make sure that you call for this appointment the day you arrive home to insure a convenient appointment time. °10. IF YOU HAVE DISABILITY OR FAMILY LEAVE FORMS, BRING THEM TO THE OFFICE FOR PROCESSING.  DO NOT GIVE THEM TO YOUR DOCTOR. ° ° ° ° ° ° ° °WHEN TO CALL US [phone]: °1. Poor pain control °2. Reactions / problems with new medications (rash/itching, nausea, etc)  °3. Fever over 101.5 F (38.5 C) °4. Inability to urinate °5. Nausea and/or   vomiting 6. Worsening swelling or bruising 7. Continued bleeding from incision. 8. Increased pain, redness, or drainage from the incision  The clinic staff is available to answer your questions during regular business hours (8:30am-5pm).  Please dont hesitate to call and ask to speak to one of our nurses for clinical concerns.   A surgeon from [facility] is always on call at the hospitals   If you have a medical emergency, go to the nearest emergency room or call 911.    [facility] [address], [place], [place]  [address] ? MAIN: [phone] ? TOLL FREE: [phone] ? FAX [phone] [email or web address]  STOP SMOKING!  We strongly recommend that you stop smoking.  Smoking increases the risk of surgery including infection in the form of an open wound, pus formation, abscess, hernia at an incision on the abdomen, etc.  You have an increased risk of other MAJOR complications such as stroke, heart attack, forming clots in the leg and/or lungs, and death.    Smoking Cessation Quitting smoking is important to your health and has many  advantages. However, it is not always easy to quit since nicotine is a very addictive drug. Often times, people try 3 times or more before being able to quit. This document explains the best ways for you to prepare to quit smoking. Quitting takes hard work and a lot of effort, but you can do it. ADVANTAGES OF QUITTING SMOKING 6. You will live longer, feel better, and live better. 7. Your body will feel the impact of quitting smoking almost immediately. 8. Within 20 minutes, blood pressure decreases. Your pulse returns to its normal level. 9. After 8 hours, carbon monoxide levels in the blood return to normal. Your oxygen level increases. 10. After 24 hours, the chance of having a heart attack starts to decrease. Your breath, hair, and body stop smelling like smoke. 11. After 48 hours, damaged nerve endings begin to recover. Your sense of taste and smell improve. 12. After 72 hours, the body is virtually free of nicotine. Your bronchial tubes relax and breathing becomes easier. 13. After 2 to 12 weeks, lungs can hold more air. Exercise becomes easier and circulation improves. 14. The risk of having a heart attack, stroke, cancer, or lung disease is greatly reduced. 15. After 1 year, the risk of coronary heart disease is cut in half. 16. After 5 years, the risk of stroke falls to the same as a nonsmoker. 17. After 10 years, the risk of lung cancer is cut in half and the risk of other cancers decreases significantly. 18. After 15 years, the risk of coronary heart disease drops, usually to the level of a nonsmoker. 19. If you are pregnant, quitting smoking will improve your chances of having a healthy baby. 20. The people you live with, especially any children, will be healthier. 21. You will have extra money to spend on things other than cigarettes. QUESTIONS TO THINK ABOUT BEFORE ATTEMPTING TO QUIT You may want to talk about your answers with your caregiver.  Why do you want to quit?  If you  tried to quit in the past, what helped and what did not?  What will be the most difficult situations for you after you quit? How will you plan to handle them?  Who can help you through the tough times? Your family? Friends? A caregiver?  What pleasures do you get from smoking? What ways can you still get pleasure if you  quit? Here are some questions to ask your caregiver:  How can you help me to be successful at quitting?  What medicine do you think would be best for me and how should I take it?  What should I do if I need more help?  What is smoking withdrawal like? How can I get information on withdrawal? GET READY  Set a quit date.  Change your environment by getting rid of all cigarettes, ashtrays, matches, and lighters in your home, car, or work. Do not let people smoke in your home.  Review your past attempts to quit. Think about what worked and what did not. GET SUPPORT AND ENCOURAGEMENT You have a better chance of being successful if you have help. You can get support in many ways.  Tell your family, friends, and co-workers that you are going to quit and need their support. Ask them not to smoke around you.  Get individual, group, or telephone counseling and support. Programs are available at [employer] and health centers. Call your local health department for information about programs in your area.  Spiritual beliefs and practices may help some smokers quit.  Download a "quit meter" on your computer to keep track of quit statistics, such as how long you have gone without smoking, cigarettes not smoked, and money saved.  Get a self-help book about quitting smoking and staying off of tobacco. LEARN NEW SKILLS AND BEHAVIORS  Distract yourself from urges to smoke. Talk to someone, go for a walk, or occupy your time with a task.  Change your normal routine. Take a different route to work. Drink tea instead of coffee. Eat breakfast in a different place.  Reduce your  stress. Take a hot bath, exercise, or read a book.  Plan something enjoyable to do every day. Reward yourself for not smoking.  Explore interactive web-based programs that specialize in helping you quit. GET MEDICINE AND USE IT CORRECTLY Medicines can help you stop smoking and decrease the urge to smoke. Combining medicine with the above behavioral methods and support can greatly increase your chances of successfully quitting smoking.  Nicotine replacement therapy helps deliver nicotine to your body without the negative effects and risks of smoking. Nicotine replacement therapy includes nicotine gum, lozenges, inhalers, nasal sprays, and skin patches. Some may be available over-the-counter and others require a prescription.  Antidepressant medicine helps people abstain from smoking, but how this works is unknown. This medicine is available by prescription.  Nicotinic receptor partial agonist medicine simulates the effect of nicotine in your brain. This medicine is available by prescription. Ask your caregiver for advice about which medicines to use and how to use them based on your health history. Your caregiver will tell you what side effects to look out for if you choose to be on a medicine or therapy. Carefully read the information on the package. Do not use any other product containing nicotine while using a nicotine replacement product.  RELAPSE OR DIFFICULT SITUATIONS Most relapses occur within the first 3 months after quitting. Do not be discouraged if you start smoking again. Remember, most people try several times before finally quitting. You may have symptoms of withdrawal because your body is used to nicotine. You may crave cigarettes, be irritable, feel very hungry, cough often, get headaches, or have difficulty concentrating. The withdrawal symptoms are only temporary. They are strongest when you first quit, but they will go away within 10 14 days. To reduce the chances of relapse, try  to:  Avoid drinking alcohol. Drinking lowers your chances of successfully quitting.  Reduce the amount of caffeine you consume. Once you quit smoking, the amount of caffeine in your body increases and can give you symptoms, such as a rapid heartbeat, sweating, and anxiety.  Avoid smokers because they can make you want to smoke.  Do not let weight gain distract you. Many smokers will gain weight when they quit, usually less than 10 pounds. Eat a healthy diet and stay active. You can always lose the weight gained after you quit.  Find ways to improve your mood other than smoking. FOR MORE INFORMATION  www.smokefree.gov    While it can be one of the most difficult things to do, the [place] has programs to help you stop.  Consider talking with your primary care physician about options.  Also, Smoking Cessation classes are available through the [facility]:  The smoking cessation program is a proven-effective program from the American Lung Association. The program is available for anyone 57 and older who currently smokes. The program lasts for 7 weeks and is 8 sessions. Each class will be approximately 1 1/2 hours. The program is every Tuesday.  All classes are 12-1:30pm and same location.  Event Location Information:  Location: [facility] 2nd Floor Conference Room 2-037; located next to [facility] cross [address]: [address] [facility] Entrance into the [facility] is adjacent to the [facility] main entrance. The conference room is located on the 2nd floor.  Parking Instructions: Visitor parking is adjacent to [employer] main entrance and the Cancer Center    A smoking cessation program is also offered through the [facility]. Register online at [email or web address] or call [phone] for more information.   Tobacco cessation counseling is available at [facility]. Call  [phone] for a free appointment.   Tobacco cessation classes also are available through the [facility] in [place]. For information, call [phone].   The Patient Education Network features videos on tobacco cessation. Please consult your listings in the center of this book to find instructions on how to access this resource.   If you want more information, ask your nurse.   Managing Pain  Pain after surgery or related to activity is often due to strain/injury to muscle, tendon, nerves and/or incisions.  This pain is usually short-term and will improve in a few months.   Many people find it helpful to do the following things TOGETHER to help speed the process of healing and to get back to regular activity more quickly:  1. Avoid heavy physical activity at first a. No lifting greater than 20 pounds at first, then increase to lifting as tolerated over the next few weeks b. Do not push through the pain.  Listen to your body and avoid positions and maneuvers than reproduce the pain.  Wait a few days before trying something more intense c. Walking is okay as tolerated, but go slowly and stop when getting sore.  If you can walk 30 minutes without stopping or pain, you can try more intense activity (running, jogging, aerobics, cycling, swimming, treadmill, sex, sports, weightlifting, etc ) d. Remember: If it hurts to do it, then dont do it!  2. Take Anti-inflammatory medication a. Choose ONE of the following over-the-counter medications: i.            Acetaminophen 500mg  tabs (Tylenol) 1-2 pills with every meal  and just before bedtime (avoid if you have liver problems) ii.            Naproxen 220mg  tabs (ex. Aleve) 1-2 pills twice a day (avoid if you have kidney, stomach, IBD, or bleeding problems) iii. Ibuprofen 200mg  tabs (ex. Advil, Motrin) 3-4 pills with every meal and just before bedtime (avoid if you have kidney, stomach, IBD, or bleeding problems) b. Take with  food/snack around the clock for 1-2 weeks i. This helps the muscle and nerve tissues become less irritable and calm down faster  3. Use a Heating pad or Ice/Cold Pack a. 4-6 times a day b. May use warm bath/hottub  or showers  4. Try Gentle Massage and/or Stretching  a. at the area of pain many times a day b. stop if you feel pain - do not overdo it  Try these steps together to help you body heal faster and avoid making things get worse.  Doing just one of these things may not be enough.    If you are not getting better after two weeks or are noticing you are getting worse, contact our office for further advice; we may need to re-evaluate you & see what other things we can do to help.  GETTING TO GOOD BOWEL HEALTH. Irregular bowel habits such as constipation and diarrhea can lead to many problems over time.  Having one soft bowel movement a day is the most important way to prevent further problems.  The anorectal canal is designed to handle stretching and feces to safely manage our ability to get rid of solid waste (feces, poop, stool) out of our body.  BUT, hard constipated stools can act like ripping concrete bricks and diarrhea can be a burning fire to this very sensitive area of our body, causing inflamed hemorrhoids, anal fissures, increasing risk is perirectal abscesses, abdominal pain/bloating, an making irritable bowel worse.      The goal: ONE SOFT BOWEL MOVEMENT A DAY!  To have soft, regular bowel movements:   Drink plenty of fluids, consider 4-6 tall glasses of water a day.    Take plenty of fiber.  Fiber is the undigested part of plant food that passes into the colon, acting s natures broom to encourage bowel motility and movement.  Fiber can absorb and hold large amounts of water. This results in a larger, bulkier stool, which is soft and easier to pass. Work gradually over several weeks up to 6 servings a day of fiber (25g a day even more if needed) in the form of: o Vegetables  -- Root (potatoes, carrots, turnips), leafy green (lettuce, salad greens, celery, spinach), or cooked high residue (cabbage, broccoli, etc) o Fruit -- Fresh (unpeeled skin & pulp), Dried (prunes, apricots, cherries, etc ),  or stewed ( applesauce)  o Whole grain breads, pasta, etc (whole wheat)  o Bran cereals   Bulking Agents -- This type of water-retaining fiber generally is easily obtained each day by one of the following:  o Psyllium bran -- The psyllium plant is remarkable because its ground seeds can retain so much water. This product is available as Metamucil, Konsyl, Effersyllium, Per Diem Fiber, or the less expensive generic preparation in drug and health food stores. Although labeled a laxative, it really is not a laxative.  o Methylcellulose -- This is another fiber derived from wood which also retains water. It is available as Citrucel. o Polyethylene Glycol - and artificial fiber commonly called Miralax or Glycolax.  It is helpful  for people with gassy or bloated feelings with regular fiber o Flax Seed - a less gassy fiber than psyllium  No reading or other relaxing activity while on the toilet. If bowel movements take longer than 5 minutes, you are too constipated  AVOID CONSTIPATION.  High fiber and water intake usually takes care of this.  Sometimes a laxative is needed to stimulate more frequent bowel movements, but   Laxatives are not a good long-term solution as it can wear the colon out.  They can help jump-start bowels if constipated, but should be relied on constantly without discussing with your doctor o Osmotics (Milk of Magnesia, Fleets phosphosoda, Magnesium citrate, MiraLax, GoLytely) are safer than  o Stimulants (Senokot, Castor Oil, Dulcolax, Ex Lax)    o Avoid taking laxatives for more than 7 days in a row.   IF SEVERELY CONSTIPATED, try a Bowel Retraining Program: o Do not use laxatives.  o Eat a diet high in roughage, such as bran cereals and leafy vegetables.   o Drink six (6) ounces of prune or apricot juice each morning.  o Eat two (2) large servings of stewed fruit each day.  o Take one (1) heaping tablespoon of a psyllium-based bulking agent twice a day. Use sugar-free sweetener when possible to avoid excessive calories.  o Eat a normal breakfast.  o Set aside 15 minutes after breakfast to sit on the toilet, but do not strain to have a bowel movement.  o If you do not have a bowel movement by the third day, use an enema and repeat the above steps.   Controlling diarrhea o Switch to liquids and simpler foods for a few days to avoid stressing your intestines further. o Avoid dairy products (especially milk & ice cream) for a short time.  The intestines often can lose the ability to digest lactose when stressed. o Avoid foods that cause gassiness or bloating.  Typical foods include beans and other legumes, cabbage, broccoli, and dairy foods.  Every person has some sensitivity to other foods, so listen to our body and avoid those foods that trigger problems for you. o Adding fiber (Citrucel, Metamucil, psyllium, Miralax) gradually can help thicken stools by absorbing excess fluid and retrain the intestines to act more normally.  Slowly increase the dose over a few weeks.  Too much fiber too soon can backfire and cause cramping & bloating. o Probiotics (such as active yogurt, Align, etc) may help repopulate the intestines and colon with normal bacteria and calm down a sensitive digestive tract.  Most studies show it to be of mild help, though, and such products can be costly. o Medicines: - Bismuth subsalicylate (ex. Kayopectate, Pepto Bismol) every 30 minutes for up to 6 doses can help control diarrhea.  Avoid if pregnant. - Loperamide (Immodium) can slow down diarrhea.  Start with two tablets (4mg  total) first and then try one tablet every 6 hours.  Avoid if you are having fevers or severe pain.  If you are not better or start feeling worse, stop all  medicines and call your doctor for advice o Call your doctor if you are getting worse or not better.  Sometimes further testing (cultures, endoscopy, X-ray studies, bloodwork, etc) may be needed to help diagnose and treat the cause of the diarrhea.  TROUBLESHOOTING IRREGULAR BOWELS 1) Avoid extremes of bowel movements (no bad constipation/diarrhea) 2) Miralax 17gm mixed in 8oz. water or juice-daily. May use BID as needed.  3) Gas-x,Phazyme, etc. as needed for gas &  bloating.  4) Soft,bland diet. No spicy,greasy,fried foods.  5) Prilosec over-the-counter as needed  6) May hold gluten/wheat products from diet to see if symptoms improve.  7)  May try probiotics (Align, Activa, etc) to help calm the bowels down 7) If symptoms become worse call back immediately.  Perirectal Abscess An abscess is an infected area that contains a collection of pus. A perirectal abscess is an abscess that is near the opening of the anus or around the rectum. A perirectal abscess can cause a lot of pain, especially during bowel movements. CAUSES This condition is almost always caused by an infection that starts in an anal gland. RISK FACTORS This condition is more likely to develop in: 22. People with diabetes or inflammatory bowel disease. 23. People whose body defense system (immune system) is weak. 24. People who have anal sex. 25. People who have a sexually transmitted disease (STD). 26. People who have certain kinds of cancers, such as rectal carcinoma, leukemia, or lymphoma. SYMPTOMS The main symptom of this condition is pain. The pain may be a throbbing pain that gets worse during bowel movements. Other symptoms include:  Fever.  Swelling.  Redness.  Bleeding.  Constipation. DIAGNOSIS The condition is diagnosed with a physical exam. If the abscess is not visible, a health care provider may need to place a finger inside the rectum to find the abscess. Sometimes, imaging tests are done to determine  the size and location of the abscess. These tests may include:  An ultrasound.  An MRI.  A CT scan. TREATMENT This condition is usually treated with incision and drainage surgery. Incision and drainage surgery involves making an incision over the abscess to drain the pus. Treatment may also involve antibiotic medicine, pain medicine, stool softeners, or laxatives. HOME CARE INSTRUCTIONS  Take medicines only as directed by your health care provider.  If you were prescribed an antibiotic, finish all of it even if you start to feel better.  To relieve pain, try sitting:  In a warm, shallow bath (sitz bath).  On a heating pad with the setting on low.  On an inflatable donut-shaped cushion.  Follow any diet instructions as directed by your health care provider.  Keep all follow-up visits as directed by your health care provider. This is important. SEEK MEDICAL CARE IF:  Your abscess is bleeding.  You have pain, swelling, or redness that is getting worse.  You are constipated.  You feel ill.  You have muscle aches or chills.  You have a fever.  Your symptoms return after the abscess has healed.   This information is not intended to replace advice given to you by your health care provider. Make sure you discuss any questions you have with your health care provider.   Document Released: 04/29/2000 Document Revised: 01/21/2015 Document Reviewed: 03/12/2014 Elsevier Interactive Patient Education 2016 [employer]  HEMORRHOIDS  The rectum is the last foot of your colon, and it naturally stretches to hold stool.  Hemorrhoidal piles are natural clusters of blood vessels that help the rectum and anal canal stretch to hold stool and allow bowel movements to eliminate feces.   Hemorrhoids are abnormally swollen blood vessels in the rectum.  Too much pressure in the rectum causes hemorrhoids by forcing blood to stretch and bulge the walls of the veins, sometimes even rupturing them.   Hemorrhoids can become like varicose veins you might see on a person's legs.  Most people will develop a flare of hemorrhoids in their lifetime.  When bulging hemorrhoidal veins are irritated, they can swell, burn, itch, cause pain, and bleed.  Most flares will calm down gradually own within a few weeks.  However, once hemorrhoids are created, they are difficult to get rid of completely and tend to flare more easily than the first flare.   Fortunately, good habits and simple medical treatment usually control hemorrhoids well, and surgery is needed only in severe cases. Types of Hemorrhoids:  Internal hemorrhoids usually don't initially hurt or itch; they are deep inside the rectum and usually have no sensation. If they begin to push out (prolapse), pain and burning can occur.  However, internal hemorrhoids can bleed.  Anal bleeding should not be ignored since bleeding could come from a dangerous source like colorectal cancer, so persistent rectal bleeding should be investigated by a doctor, sometimes with a colonoscopy.  External hemorrhoids cause most of the symptoms - pain, burning, and itching. Nonirritated hemorrhoids can look like small skin tags coming out of the anus.   Thrombosed hemorrhoids can form when a hemorrhoid blood vessel bursts and causes the hemorrhoid to suddenly swell.  A purple blood clot can form in it and become an excruciatingly painful lump at the anus. Because of these unpleasant symptoms, immediate incision and drainage by a surgeon at an office visit can provide much relief of the pain.    PREVENTION Avoiding the most frequent causes listed below will prevent most cases of hemorrhoids: Constipation Hard stools Diarrhea  Constant sitting  Straining with bowel movements Sitting on the toilet for a long time  Severe coughing  episodes Pregnancy / Childbirth  Heavy Lifting  Sometimes avoiding the above triggers is difficult:  How can you avoid sitting all day if you have a  seated job? Also, we try to avoid coughing and diarrhea, but sometimes its beyond your control.  Still, there are some practical hints to help: Keep the anal and genital area clean.  Moistened tissues such as flushable wet wipes are less irritating than toilet paper.  Using irrigating showers or bottle irrigation washing gently cleans this sensitive area.   Avoid dry toilet paper when cleaning after bowel movements.  Marland Kitchen Keep the anal and genital area dry.  Lightly pat the rectal area dry.  Avoid rubbing.  Talcum or baby powders can help GET YOUR STOOLS SOFT.   This is the most important way to prevent irritated hemorrhoids.  Hard stools are like sandpaper to the anorectal canal and will cause more problems.  The goal: ONE SOFT BOWEL MOVEMENT A DAY!  BMs from every other day to 3 times a day is a tolerable range Treat coughing, diarrhea and constipation early since irritated hemorrhoids may soon follow.  If your main job activity is seated, always stand or walk during your breaks. Make it a point to stand and walk at least 5 minutes every hour and try to shift frequently in your chair to avoid direct rectal pressure.  Always exhale as you strain or lift. Don't hold your breath.  Do not delay or try to prevent a bowel movement when the urge is present. Exercise regularly (walking or jogging 60 minutes a day) to stimulate the bowels to move. No reading or other activity while on the toilet. If bowel movements take longer than 5 minutes, you are too constipated. AVOID CONSTIPATION Drink plenty of liquids (1 1/2 to 2 quarts of water and other fluids a day unless fluid restricted for another medical condition). Liquids that contain caffeine (coffee a,  tea, soft drinks) can be dehydrating and should be avoided until constipation is controlled. Consider minimizing milk, as dairy products may be constipating. Eat plenty of fiber (30g a day ideal, more if needed).  Fiber is the undigested part of plant food that  passes into the colon, acting as natures broom to encourage bowel motility and movement.  Fiber can absorb and hold large amounts of water. This results in a larger, bulkier stool, which is soft and easier to pass.  Eating foods high in fiber - 12 servings - such as  Vegetables: Root (potatoes, carrots, turnips), Leafy green (lettuce, salad greens, celery, spinach), High residue (cabbage, broccoli, etc.) Fruit: Fresh, Dried (prunes, apricots, cherries), Stewed (applesauce)  Whole grain breads, pasta, whole wheat Bran cereals, muffins, etc. Consider adding supplemental bulking fiber which retains large volumes of water: Psyllium ground seeds (native plant from [place])--available as Metamucil, Konsyl, Effersyllium, Per Diem Fiber, or the less expensive generic forms.  Citrucel  (methylcellulose wood fiber) . FiberCon (Polycarbophil) Polyethylene Glycol - and artificial fiber commonly called Miralax or Glycolax.  It is helpful for people with gassy or bloated feelings with regular fiber Flax Seed - a less gassy natural fiber  Laxatives can be useful for a short period if constipation is severe Osmotics (Milk of Magnesia, Fleets Phospho-Soda, Magnesium Citrate)  Stimulants (Senokot,   Castor Oil,  Dulcolax, Ex-Lax)    Laxatives are not a good long-term solution as it can stress the bowels and cause too much mineral loss and dehydration.   Avoid taking laxatives for more than 7 days in a row.  AVOID DIARRHEA Switch to liquids and simpler foods for a few days to avoid stressing your intestines further. Avoid dairy products (especially milk & ice cream) for a short time.  The intestines often can lose the ability to digest lactose when stressed. Avoid foods that cause gassiness or bloating.  Typical foods include beans and other legumes, cabbage, broccoli, and dairy foods.  Every person has some sensitivity to other foods, so listen to your body and avoid those foods that trigger problems for  you. Adding fiber (Citrucel, Metamucil, FiberCon, Flax seed, Miralax) gradually can help thicken stools by absorbing excess fluid and retrain the intestines to act more normally.  Slowly increase the dose over a few weeks.  Too much fiber too soon can backfire and cause cramping & bloating. Probiotics (such as active yogurt, Align, etc) may help repopulate the intestines and colon with normal bacteria and calm down a sensitive digestive tract.  Most studies show it to be of mild help, though, and such products can be costly. Medicines: Bismuth subsalicylate (ex. Kayopectate, Pepto Bismol) every 30 minutes for up to 6 doses can help control diarrhea.  Avoid if pregnant. Loperamide (Immodium) can slow down diarrhea.  Start with two tablets (4mg  total) first and then try one tablet every 6 hours.  Avoid if you are having fevers or severe pain.  If you are not better or start feeling worse, stop all medicines and call your doctor for advice Call your doctor if you are getting worse or not better.  Sometimes further testing (cultures, endoscopy, X-ray studies, bloodwork, etc) may be needed to help diagnose and treat the cause of the diarrhea.  TROUBLESHOOTING IRREGULAR BOWELS 1) Avoid extremes of bowel movements (no bad constipation/diarrhea) 2) Miralax 17gm mixed in 8oz. water or juice-daily. May use BID as needed.  3) Gas-x,Phazyme, etc. as needed for gas & bloating.  4) Soft,bland diet. No  spicy,greasy,fried foods.  5) Prilosec over-the-counter as needed  6) May hold gluten/wheat products from diet to see if symptoms improve.  7)  May try probiotics (Align, Activa, etc) to help calm the bowels down 7) If symptoms become worse call back immediately.   TREATMENT OF HEMORRHOID FLARE If these preventive measures fail, you must take action right away! Hemorrhoids are one condition that can be mild in the morning and become intolerable by nightfall. Most hemorrhoidal flares take several weeks to calm  down.  These suggestions can help: Warm soaks.  This helps more than any topical medication.  Use up to 8 times a day.  Usually sitz baths or sitting in a warm bathtub helps.  Sitting on moist warm towels are helpful.  Switching to ice packs/cool compresses can be helpful  Use a Sitz Bath 4-8 times a day for relief A sitz bath is a warm water bath taken in the sitting position that covers only the hips and buttocks. It may be used for either healing or hygiene purposes. Sitz baths are also used to relieve pain, itching, or muscle spasms. The water may contain medicine. Moist heat will help you heal and relax.  HOME CARE INSTRUCTIONS  Take 3 to 4 sitz baths a day. 27. Fill the bathtub half full with warm water. 28. Sit in the water and open the drain a little. 29. Turn on the warm water to keep the tub half full. Keep the water running constantly. 30. Soak in the water for 15 to 20 minutes. 31. After the sitz bath, pat the affected area dry first. SEEK MEDICAL CARE IF:  You get worse instead of better. Stop the sitz baths if you get worse.  Normalize your bowels.  Extremes of diarrhea or constipation will make hemorrhoids worse.  One soft bowel movement a day is the goal.  Fiber can help get your bowels regular Wet wipes instead of toilet paper Pain control with a NSAID such as ibuprofen (Advil) or naproxen (Aleve) or acetaminophen (Tylenol) around the clock.  Narcotics are constipating and should be minimized if possible Topical creams contain steroids (bydrocortisone) or local anesthetic (xylocaine) can help make pain and itching more tolerable.   EVALUATION If hemorrhoids are still causing problems, you could benefit by an evaluation by a surgeon.  The surgeon will obtain a history and examine you.  If hemorrhoids are diagnosed, some therapies can be offered in the office, usually with an anoscope into the less sensitive area of the rectum: -injection of hemorrhoids (sclerotherapy) can scar  the blood vessels of the swollen/enlarged hemorrhoids to help shrink them down to a more normal size -rubber banding of the enlarged hemorrhoids to help shrink them down to a more normal size -drainage of the blood clot causing a thrombosed hemorrhoid,  to relieve the severe pain   While 90% of the time such problems from hemorrhoids can be managed without preceding to surgery, sometimes the hemorrhoids require a operation to control the problem (uncontrolled bleeding, prolapse, pain, etc.).   This involves being placed under general anesthesia where the surgeon can confirm the diagnosis and remove, suture, or staple the hemorrhoid(s).  Your surgeon can help you treat the problem appropriately.

## 2015-04-13 NOTE — Transfer of Care (Signed)
Immediate Anesthesia Transfer of Care Note  Patient: Otila BackLeslie Genrich  Procedure(s) Performed: Procedure(s): EXAM UNDER ANESTHESIA, INCISION  AND DRAINAGE  SUPRASPHINCTERIC RECTAL ABSCESS, INTERNAL HEMORRHOIDECTOMY (N/A)  Patient Location: PACU  Anesthesia Type:General  Level of Consciousness:  sedated, patient cooperative and responds to stimulation  Airway & Oxygen Therapy:Patient Spontanous Breathing and Patient connected to face mask oxgen  Post-op Assessment:  Report given to PACU RN and Post -op Vital signs reviewed and stable  Post vital signs:  Reviewed and stable  Last Vitals:  Filed Vitals:   04/13/15 1800 04/13/15 1824  BP: 114/57 107/51  Pulse: 76 73  Temp: 36.6 C 36.8 C  Resp: 16 16    Complications: No apparent anesthesia complications

## 2015-04-13 NOTE — ED Notes (Signed)
PA at bedside.

## 2015-04-13 NOTE — ED Notes (Signed)
Bed: [id number] Expected date:  Expected time:  Means of arrival:  Comments[facility]

## 2015-04-13 NOTE — Anesthesia Postprocedure Evaluation (Signed)
Anesthesia Post Note  Patient: Whitney [name]  Procedure(s) Performed: Procedure(s) (LRB): EXAM UNDER ANESTHESIA, INCISION  AND DRAINAGE  SUPRASPHINCTERIC RECTAL ABSCESS, INTERNAL HEMORRHOIDECTOMY (N/A)  Patient location during evaluation: PACU Anesthesia Type: General Level of consciousness: sedated Pain management: pain level controlled Vital Signs Assessment: post-procedure vital signs reviewed and stable Respiratory status: spontaneous breathing and respiratory function stable Cardiovascular status: stable Anesthetic complications: no    Last Vitals:  Filed Vitals:   04/13/15 2100 04/13/15 2109  BP:  115/57  Pulse: 97 91  Temp: 36.4 C 36.8 C  Resp: 23 14    Last Pain:  Filed Vitals:   04/13/15 2110  PainSc: 0-No pain                 Angalina Ante DANIEL

## 2015-04-13 NOTE — Progress Notes (Signed)
Whitney [name]  12/18/1975 [id number]  Patient Care Team: No Pcp Per Patient as PCP - General (General Practice)  This patient is a 39 y.o.female who calls today for surgical evaluation.   The anatomy and physiology of the region was discussed. The pathophysiology of subcutaneous abscess formation with progression to fasciitis & sepsis was discussed.  Need for incision, drainage, debridement discussed.  I stressed good hygiene & need for repeated wound care.  Possible redebridement / reoperation was discussed as well. Possibility of recurrence was discussed.   Risks of bleeding, infection, abscess, leak, injury to other organs, need for repair of tissues / organs, need for further treatment, heart attack, death, and other risks were discussed.  Benefits, alternatives were discussed. I noted a good likelihood this will help address the problem.  Questions answered.  The patient & family agree to proceed.   Patient Active Problem List   Diagnosis Date Noted  . 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 Hemorrhoids     history of     Past Surgical History  Procedure Laterality Date  . Dilation and curettage of uterus    . Tonsillectomy      Social History   Social History  . Marital Status: Single    Spouse Name: N/A  . Number of Children: N/A  . Years of Education: N/A   Occupational History  . Not on file.   Social History Main Topics  . Smoking status: Current Every Day Smoker -- 1.00 packs/day  . Smokeless tobacco: Not on file  . Alcohol Use: Yes     Comment: occasionally  . Drug Use: No  . Sexual Activity: Yes    Birth Control/ Protection: None   Other Topics Concern  . Not on file   Social History Narrative    History reviewed. No pertinent family history.  Current Facility-Administered Medications  Medication Dose Route Frequency Provider Last Rate Last Dose  . acetaminophen (TYLENOL) tablet 650 mg  650 mg Oral Q6H PRN Barnetta ChapelKelly  Osborne, PA-C      . LORazepam (ATIVAN) injection 0.5-1 mg  0.5-1 mg Intravenous Q6H PRN Barnetta ChapelKelly Osborne, PA-C      . morphine 2 MG/ML injection 1-4 mg  1-4 mg Intravenous Q2H PRN Barnetta ChapelKelly Osborne, PA-C   2 mg at 04/13/15 1658  . nicotine (NICODERM CQ - dosed in mg/24 hours) patch 21 mg  21 mg Transdermal Daily Barnetta ChapelKelly Osborne, PA-C      . ondansetron [facility](ZOFRAN) injection 4 mg  4 mg Intravenous Q4H PRN Barnetta ChapelKelly Osborne, PA-C      . piperacillin-tazobactam (ZOSYN) IVPB 3.375 g  3.375 g Intravenous 3 times per day Barnetta ChapelKelly Osborne, PA-C         No Known Allergies  BP 107/51 mmHg  Pulse 73  Temp(Src) 98.2 F (36.8 C) (Oral)  Resp 16  Ht 5\' 6"  (1.676 m)  Wt 70.761 kg (156 lb)  BMI 25.19 kg/m2  SpO2 100%  LMP 03/24/2015  Breastfeeding? Unknown  Ct Pelvis W Contrast  04/13/2015  CLINICAL DATA:  Rectal and vaginal pain beginning yesterday. Leukocytosis. EXAM: CT PELVIS WITH CONTRAST TECHNIQUE: Multidetector CT imaging of the pelvis was performed using the standard protocol following the bolus administration of intravenous contrast. CONTRAST:  100mL OMNIPAQUE IOHEXOL 300 MG/ML SOLN, 50mL OMNIPAQUE IOHEXOL 300 MG/ML SOLN COMPARISON:  None. FINDINGS: Uterus and adnexal regions are unremarkable in appearance. No intrapelvic soft tissue masses or lymphadenopathy identified. A 1.6 cm rim enhancing  fluid collection is seen between the anus and distal vagina on image 46/series 2. This could represent a small perianal abscess or vaginal/ Bartholin's gland cyst or abscess. IMPRESSION: 1.6 cm rim enhancing fluid collection between the anus and distal vagina. Differential diagnosis includes perianal abscess and vaginal/ Bartholin's gland cyst or abscess. Consider perianal fistula protocol pelvic MRI without and with contrast for further evaluation. Electronically Signed   By: Myles Rosenthal M.D.   On: 04/13/2015 12:02    Note: This dictation was prepared with Dragon/digital dictation along with [employer]. Any  transcriptional errors that result from this process are unintentional.

## 2015-04-13 NOTE — Anesthesia Preprocedure Evaluation (Addendum)
Anesthesia Evaluation  Patient identified by MRN, date of birth, ID band Patient awake    Reviewed: Allergy & Precautions, NPO status , Patient's Chart, lab work & pertinent test results  History of Anesthesia Complications Negative for: history of anesthetic complications  Airway Mallampati: II  TM Distance: >3 FB Neck ROM: Full    Dental  (+) Poor Dentition, Dental Advisory Given   Pulmonary Current Smoker,    Pulmonary exam normal        Cardiovascular negative cardio ROS Normal cardiovascular exam     Neuro/Psych    GI/Hepatic negative GI ROS, Neg liver ROS,   Endo/Other  negative endocrine ROS  Renal/GU negative Renal ROS     Musculoskeletal   Abdominal   Peds  Hematology   Anesthesia Other Findings   Reproductive/Obstetrics                            Anesthesia Physical Anesthesia Plan  ASA: II and emergent  Anesthesia Plan: General   Post-op Pain Management:    Induction: Intravenous  Airway Management Planned: LMA  Additional Equipment:   Intra-op Plan:   Post-operative Plan: Extubation in OR  Informed Consent: I have reviewed the patients History and Physical, chart, labs and discussed the procedure including the risks, benefits and alternatives for the proposed anesthesia with the patient or authorized representative who has indicated his/her understanding and acceptance.   Dental advisory given  Plan Discussed with: Anesthesiologist  Anesthesia Plan Comments:        Anesthesia Quick Evaluation

## 2015-04-13 NOTE — Op Note (Signed)
04/13/2015  8:17 PM  PATIENT:  Whitney [name]  39 y.o. female  Patient Care Team: No Pcp Per Patient as PCP - General (General Practice)  PRE-OPERATIVE DIAGNOSIS:  peri-rectal abscess, pain  POST-OPERATIVE DIAGNOSIS:    suprasphincteric rectal abscess,  thrombosed internal hemorrhoid  PROCEDURE:  Procedure(s): EXAM UNDER ANESTHESIA INCISION  AND DRAINAGE  SUPRASPHINCTERIC RECTAL ABSCESS with marsupulization INTERNAL HEMORRHOIDECTOMY  SURGEON:  Surgeon(s): Karie Soda, MD  ASSISTANT: RN   ANESTHESIA:   local and general  EBL:     Delay start of Pharmacological VTE agent (>24hrs) due to surgical blood loss or risk of bleeding:  no  DRAINS: none   SPECIMEN:  Source of Specimen:  Right anterior internal thrombosed hemorrhoid  DISPOSITION OF SPECIMEN:  PATHOLOGY  COUNTS:  YES  PLAN OF CARE: Discharge to home after PACU  PATIENT DISPOSITION:  PACU - hemodynamically stable.  INDICATION: Smoking female with severe perineal pain.  CT scan of pelvis suspicious for abscess between the vagina and anterior penis.  Too painful to drain emergency room.  Recommendation made for examination under anesthesia.  The anatomy and physiology of skin abscesses was discussed. Pathophysiology of SQ abscess, possible progression to fasciitis & sepsis, etc discussed . I stressed good hygiene & wound care. Possible redebridement was discussed as well.   Possibility of recurrence was discussed. Risks, benefits, alternatives were discussed. I noted a good likelihood this will help address the problem. Risks of anesthesia and other risks discussed. Questions answered. The patient is does wish to proceed.   OR FINDINGS:   5cm deep 3x2x2cm abscess from right anterior midline rectal wall towards posterior rectovaginal septum.  Superior to sphincter complex.  Thrombosed right anterior internal hemorrhoid.  Abscess drained  Wound measures 3cm by 2cm.  It is 5cm deep   DESCRIPTION:   Informed  consent was confirmed. The patient received IV antibiotics. The patient underwent general anesthesia without any difficulty. The patient was positioned in high lithotomy. SCDs were active during the entire case. The area around the abscess was prepped and draped in a sterile fashion. A surgical timeout confirmed our plan.   I did digital visual and anoscopic examination.  Most firm fluctuant area was at the midline raphe between the posterior vaginal wall & anterior rectal wall.  Most of the bulging was into the anterior rectal wall.  I uses an 18-gauge needle & aspirated through a thrombosed right anterior hemorrhoid to encounter pus.  Because there is no external drainage point in the skin in the perineal body, I decided to proceed with transrectal drainage since the abscess appeared to be suprasphincteric.  I made an incision over the most fluctuant area of the mass through the thrombosed hemorrhoid and released the pus.   I placed my finger into the abscess cavity to break up loculations.  Abscess went up to the posterior rectovaginal septum but not breech across it.  Again it was superior to the sphincters but did go superficially towards the midline raphae.    I excised the thrombosed internal hemorrhoid and some of the external skin tag components as well to remove any bulging tissue at the edges.  I marsupialized the wound with 3-0 chromic running locking suture to keep the flat rectal/anal canal wound open.  Because the wound was opened and there was transrectal drainage, I did not place packing.    Fluff gauze and mesh panties applied.  Patient is being extubated go to recovery room.   We plan to continue antibiotics.  It is safe for the patient to go home as long as her pain is controlled.  She strongly desired that.  She requires oral antibiotics for 5 more days to minimize infection.  I am about to discuss OR findings with family per the patient's request  Ardeth SportsmanSteven C. Gardenia Witter, M.D.,  F.A.C.S. Gastrointestinal and Minimally Invasive Surgery [facility] [address] [place], [place] [id number] [phone] Main / Paging

## 2015-04-13 NOTE — H&P (Signed)
Whitney [name] 27-Jun-1975  [id number].   Chief Complaint/Reason for Consult: perineal pain HPI: This is a 39 yo white female who began having perineal pain a couple of days ago.  Within the last day it has progressively worsened.  She felt like her pain was initially in her vagina.  She felt like she had some vaginal prolapse.  She denies any fevers, chills.  She has been having normal BMs.  She was sitting in her bath tub for the last day and doing multiple sitz bathes.  She finally went to [facility] today for evaluation.  She had a CT of her pelvis which shows a possible perineal abscess measuring about 1.6cm.  She was transferred to [facility] for [place] to evaluate her.   ROS : Please see HPI, otherwise negative  History reviewed. No pertinent family history.  Past Medical History  Diagnosis Date  . Hemorrhoids     history of     Past Surgical History  Procedure Laterality Date  . Dilation and curettage of uterus    . Tonsillectomy      Social History:  reports that she has been smoking.  She does not have any smokeless tobacco history on file. She reports that she drinks alcohol. She reports that she does not use illicit drugs.  Allergies: No Known Allergies   (Not in a hospital admission)  Blood pressure 106/70, pulse 81, temperature 98 F (36.7 C), temperature source Oral, resp. rate 16, weight 70.761 kg (156 lb), last menstrual period 03/24/2015, SpO2 100 %, unknown if currently breastfeeding. Physical Exam: General: anxious, WD, WN white female who is laying in bed in NAD HEENT: head is normocephalic, atraumatic.  Sclera are noninjected.  PERRL.  Ears and nose without any masses or lesions.  Mouth is pink and moist Heart: regular, rate, and rhythm.  Normal s1,s2. No obvious murmurs, gallops, or rubs noted.  Palpable radial and pedal pulses bilaterally Lungs: CTAB, no wheezes, rhonchi, or rales noted.  Respiratory effort nonlabored Abd: soft, NT, ND, +BS, no masses, hernias, or  organomegaly GU: normal appearing genitalia.  She does have some fullness noted on her left side of her perineum.  With vaginal and rectal exam she has a small indurated mass like lesion noted between the vagina and rectum, more on the right side.  No drainage noted or obvious outer defect visible. Psych: A&Ox3 but very anxious.    Results for orders placed or performed during the hospital encounter of 04/13/15 (from the past 48 hour(s))  Urinalysis, Routine w reflex microscopic (not at [facility])     Status: None   Collection Time: 04/13/15  8:00 AM  Result Value Ref Range   Color, Urine YELLOW YELLOW   APPearance CLEAR CLEAR   Specific Gravity, Urine 1.015 1.005 - 1.030   pH 5.5 5.0 - 8.0   Glucose, UA NEGATIVE NEGATIVE mg/dL   Hgb urine dipstick NEGATIVE NEGATIVE   Bilirubin Urine NEGATIVE NEGATIVE   Ketones, ur NEGATIVE NEGATIVE mg/dL   Protein, ur NEGATIVE NEGATIVE mg/dL   Nitrite NEGATIVE NEGATIVE   Leukocytes, UA NEGATIVE NEGATIVE    Comment: MICROSCOPIC NOT DONE ON URINES WITH NEGATIVE PROTEIN, BLOOD, LEUKOCYTES, NITRITE, OR GLUCOSE <1000 mg/dL.  Pregnancy, urine POC     Status: None   Collection Time: 04/13/15  8:11 AM  Result Value Ref Range   Preg Test, Ur NEGATIVE NEGATIVE    Comment:        THE SENSITIVITY OF THIS METHODOLOGY IS >24 mIU/mL  Wet prep, genital     Status: Abnormal   Collection Time: 04/13/15  8:45 AM  Result Value Ref Range   Yeast Wet Prep HPF POC NONE SEEN NONE SEEN   Trich, Wet Prep NONE SEEN NONE SEEN   Clue Cells Wet Prep HPF POC FEW (A) NONE SEEN   WBC, Wet Prep HPF POC FEW (A) NONE SEEN    Comment: FEW BACTERIA SEEN   Sperm NONE SEEN   CBC     Status: Abnormal   Collection Time: 04/13/15  8:55 AM  Result Value Ref Range   WBC 17.2 (H) 4.0 - 10.5 K/uL   RBC 4.11 3.87 - 5.11 MIL/uL   Hemoglobin 11.3 (L) 12.0 - 15.0 g/dL   HCT 34.6 (L) 36.0 - 46.0 %   MCV 84.2 78.0 - 100.0 fL   MCH 27.5 26.0 - 34.0 pg   MCHC 32.7 30.0 - 36.0 g/dL   RDW 16.5  (H) 11.5 - 15.5 %   Platelets 395 150 - 400 K/uL  Comprehensive metabolic panel     Status: Abnormal   Collection Time: 04/13/15  8:55 AM  Result Value Ref Range   Sodium 135 135 - 145 mmol/L   Potassium 4.6 3.5 - 5.1 mmol/L   Chloride 108 101 - 111 mmol/L   CO2 22 22 - 32 mmol/L   Glucose, Bld 98 65 - 99 mg/dL   BUN 10 6 - 20 mg/dL   Creatinine, Ser 0.52 0.44 - 1.00 mg/dL   Calcium 9.0 8.9 - 10.3 mg/dL   Total Protein 6.6 6.5 - 8.1 g/dL   Albumin 3.8 3.5 - 5.0 g/dL   AST 12 (L) 15 - 41 U/L   ALT 13 (L) 14 - 54 U/L   Alkaline Phosphatase 47 38 - 126 U/L   Total Bilirubin 0.3 0.3 - 1.2 mg/dL   GFR calc non Af Amer >60 >60 mL/min   GFR calc Af Amer >60 >60 mL/min    Comment: (NOTE) The eGFR has been calculated using the CKD EPI equation. This calculation has not been validated in all clinical situations. eGFR's persistently <60 mL/min signify possible Chronic Kidney Disease.    Anion gap 5 5 - 15   Ct Pelvis W Contrast  04/13/2015  CLINICAL DATA:  Rectal and vaginal pain beginning yesterday. Leukocytosis. EXAM: CT PELVIS WITH CONTRAST TECHNIQUE: Multidetector CT imaging of the pelvis was performed using the standard protocol following the bolus administration of intravenous contrast. CONTRAST:  18mL OMNIPAQUE IOHEXOL 300 MG/ML SOLN, 3mL OMNIPAQUE IOHEXOL 300 MG/ML SOLN COMPARISON:  None. FINDINGS: Uterus and adnexal regions are unremarkable in appearance. No intrapelvic soft tissue masses or lymphadenopathy identified. A 1.6 cm rim enhancing fluid collection is seen between the anus and distal vagina on image 46/series 2. This could represent a small perianal abscess or vaginal/ Bartholin's gland cyst or abscess. IMPRESSION: 1.6 cm rim enhancing fluid collection between the anus and distal vagina. Differential diagnosis includes perianal abscess and vaginal/ Bartholin's gland cyst or abscess. Consider perianal fistula protocol pelvic MRI without and with contrast for further  evaluation. Electronically Signed   By: Earle Gell M.D.   On: 04/13/2015 12:02       Assessment/Plan 1. Perineal pain, with probably abscess -admit and place on zosyn -plan for EUA with possible I&D tonight. -prn pain meds, anxiety meds, and nicotine patch -plan d/w patient and she agrees and willing to proceed.  Zaylee Cornia E 04/13/2015, 4:13 PM Pager: [phone]

## 2015-04-13 NOTE — MAU Provider Note (Signed)
History     CSN: [id number]  Arrival date and time: 04/13/15 6195   First Provider Initiated Contact with Patient 04/13/15 [phone]      Chief Complaint  Patient presents with  . perineal swelling    HPI    Ms.Whitney [name] is a 39 y.o. non- pregnant female [phone] presenting to MAU with vulvar pain, and perianal redness/ swelling.   She states it feels like her vulva area is inflamed, raw and rough. She feels pain and pressure to perineal area between vaginal opening and anus. " It feels like a baseball there." She has lower abdominal pain also. Symptoms started yesterday. Last BM yesterday with hard stool. She states it feels like her vagina is going to come out when she strains to have BM. Denies blood in stool. States coughing and sneezing worsens pain and pressure in perineal area. Denies vaginal itching or discharge. Denies dysuria or difficulty initiating stream. She states she had sexual intercourse last week and is unsure if the condom came off or not. She does not use other forms of birth control. She denies fever, chills, N/V/D. She has had a similar incident in the past and was diagnosed with a vaginal fissure. She states she took either a Hydrocodone or Motrin 600 mg tablet this AM. She is unsure which medication she took, but it did not improve her pain. She has a family hx of hemorrhoids and she has a colonoscopy scheduled in Dec.    OB History    Gravida Para Term Preterm AB TAB SAB Ectopic Multiple Living   _0 Past Medical History  Diagnosis Date  . Hemorrhoids     history of     Past Surgical History  Procedure Laterality Date  . Dilation and curettage of uterus    . Tonsillectomy      No family history on file.  Social History  Substance Use Topics  . Smoking status: Current Every Day Smoker -- 1.00 packs/day  . Smokeless tobacco: None  . Alcohol Use: No     Comment: occasionally    Allergies: No Known Allergies  Prescriptions prior  to admission  Medication Sig Dispense Refill Last Dose  . ibuprofen (ADVIL,MOTRIN) 800 MG tablet Take 800 mg by mouth every 8 (eight) hours as needed for moderate pain.   04/13/2015 at Unknown time  . OVER THE COUNTER MEDICATION Take 1 tablet by mouth daily. Patient takes over the counter Vitamin D   04/12/2015 at Unknown time  . vitamin C (ASCORBIC ACID) 500 MG tablet Take 500 mg by mouth.   04/12/2015 at Unknown time  . promethazine (PHENERGAN) 25 MG tablet Take 1 tablet (25 mg total) by mouth every 6 (six) hours as needed for nausea or vomiting. (Patient not taking: Reported on 04/13/2015) 30 tablet 2 Not Taking at Unknown time   Results for orders placed or performed during the hospital encounter of 04/13/15 (from the past 48 hour(s))  Urinalysis, Routine w reflex microscopic (not at [facility])     Status: None   Collection Time: 04/13/15  8:00 AM  Result Value Ref Range   Color, Urine YELLOW YELLOW   APPearance CLEAR CLEAR   Specific Gravity, Urine 1.015 1.005 - 1.030   pH 5.5 5.0 - 8.0   Glucose, UA NEGATIVE NEGATIVE mg/dL   Hgb urine dipstick NEGATIVE NEGATIVE   Bilirubin Urine NEGATIVE NEGATIVE   Ketones, ur NEGATIVE NEGATIVE mg/dL  Protein, ur NEGATIVE NEGATIVE mg/dL   Nitrite NEGATIVE NEGATIVE   Leukocytes, UA NEGATIVE NEGATIVE    Comment: MICROSCOPIC NOT DONE ON URINES WITH NEGATIVE PROTEIN, BLOOD, LEUKOCYTES, NITRITE, OR GLUCOSE <1000 mg/dL.  Pregnancy, urine POC     Status: None   Collection Time: 04/13/15  8:11 AM  Result Value Ref Range   Preg Test, Ur NEGATIVE NEGATIVE    Comment:        THE SENSITIVITY OF THIS METHODOLOGY IS >24 mIU/mL   Wet prep, genital     Status: Abnormal   Collection Time: 04/13/15  8:45 AM  Result Value Ref Range   Yeast Wet Prep HPF POC NONE SEEN NONE SEEN   Trich, Wet Prep NONE SEEN NONE SEEN   Clue Cells Wet Prep HPF POC FEW (A) NONE SEEN   WBC, Wet Prep HPF POC FEW (A) NONE SEEN    Comment: FEW BACTERIA SEEN   Sperm NONE SEEN   CBC      Status: Abnormal   Collection Time: 04/13/15  8:55 AM  Result Value Ref Range   WBC 17.2 (H) 4.0 - 10.5 K/uL   RBC 4.11 3.87 - 5.11 MIL/uL   Hemoglobin 11.3 (L) 12.0 - 15.0 g/dL   HCT 34.6 (L) 36.0 - 46.0 %   MCV 84.2 78.0 - 100.0 fL   MCH 27.5 26.0 - 34.0 pg   MCHC 32.7 30.0 - 36.0 g/dL   RDW 16.5 (H) 11.5 - 15.5 %   Platelets 395 150 - 400 K/uL  Comprehensive metabolic panel     Status: Abnormal   Collection Time: 04/13/15  8:55 AM  Result Value Ref Range   Sodium 135 135 - 145 mmol/L   Potassium 4.6 3.5 - 5.1 mmol/L   Chloride 108 101 - 111 mmol/L   CO2 22 22 - 32 mmol/L   Glucose, Bld 98 65 - 99 mg/dL   BUN 10 6 - 20 mg/dL   Creatinine, Ser 0.52 0.44 - 1.00 mg/dL   Calcium 9.0 8.9 - 10.3 mg/dL   Total Protein 6.6 6.5 - 8.1 g/dL   Albumin 3.8 3.5 - 5.0 g/dL   AST 12 (L) 15 - 41 U/L   ALT 13 (L) 14 - 54 U/L   Alkaline Phosphatase 47 38 - 126 U/L   Total Bilirubin 0.3 0.3 - 1.2 mg/dL   GFR calc non Af Amer >60 >60 mL/min   GFR calc Af Amer >60 >60 mL/min    Comment: (NOTE) The eGFR has been calculated using the CKD EPI equation. This calculation has not been validated in all clinical situations. eGFR's persistently <60 mL/min signify possible Chronic Kidney Disease.    Anion gap 5 5 - 15   Ct Pelvis W Contrast  04/13/2015  CLINICAL DATA:  Rectal and vaginal pain beginning yesterday. Leukocytosis. EXAM: CT PELVIS WITH CONTRAST TECHNIQUE: Multidetector CT imaging of the pelvis was performed using the standard protocol following the bolus administration of intravenous contrast. CONTRAST:  137m OMNIPAQUE IOHEXOL 300 MG/ML SOLN, 5106mOMNIPAQUE IOHEXOL 300 MG/ML SOLN COMPARISON:  None. FINDINGS: Uterus and adnexal regions are unremarkable in appearance. No intrapelvic soft tissue masses or lymphadenopathy identified. A 1.6 cm rim enhancing fluid collection is seen between the anus and distal vagina on image 46/series 2. This could represent a small perianal abscess or vaginal/  Bartholin's gland cyst or abscess. IMPRESSION: 1.6 cm rim enhancing fluid collection between the anus and distal vagina. Differential diagnosis includes perianal abscess and vaginal/ Bartholin's gland  cyst or abscess. Consider perianal fistula protocol pelvic MRI without and with contrast for further evaluation. Electronically Signed   By: Earle Gell M.D.   On: 04/13/2015 12:02   ROS   ROS: Constitutional: Denies general weakness, fatigue.  GI: C/o lower abdominal pain. Denies constipation, N/V/D.  GU: Denies vaginal bleeding, hematuria, dysuria.  Other ROS negative.  Physical Exam   Blood pressure 109/71, pulse 102, temperature 97.8 F (36.6 C), temperature source Oral, resp. rate 18, weight 156 lb (70.761 kg), last menstrual period 03/24/2015, unknown if currently breastfeeding.  Physical Exam   PE: Constitutional: Well developed, well nourished. Alert to person, place, time. No acute distress.  Head: Normocephalic, atraumatic.  Eyes: PERRLA Neck: Supple CV: Normal rate, rhythm. No murmurs, rubs, gallops.  Respiratory: Normal effort, no wheezes.  GI: Pain to palpation to lower abdomen near groin. Bowel sounds present in all 4 quadrants.  Genitourinary:  Swelling to posterior labia minora. Pain with palpation to posterior perineal area. Negative swelling, tenderness to bartholin's gland area.  Speculum exam: Vagina: No discharge. Rugae pink. No trauma, lesions. Cervix: No contact bleeding, cervix closed. Bimanual exam: No CMT. Uterus non-tender, normal size. Adnexa: Non-tender, no masses or lesions bilaterally. GC Chlamydia, wet prep done. Rectal exam: tender with insertion. Tenderness throughout anal cavity; patient did not tolerate rectal exam.  Chaperone present for exam.  MSK: Normal ROM Neurological: Alert, oriented to person, place, time.  Skin: Warm, dry. No rashes, lesions noted.  Psychiatric: Normal, appropriate behavior. Normal organized through process.    MAU Course   Procedures  None  MDM  GC Chlamydia/wet prep CBC/CMP drawn Pt medicated with Toradol 27m IM and Vicodin 1 tablet PO; patient rates her pain 0/10 Consult with Dr. SNehemiah Settleregarding perirectal abscess CT of pelvis with contrast obtained with differential diagnosis of perirectal abscess vs Bartholin gland cyst abscess  Referral to surgery per Dr. SNehemiah Settle  1400: spoke to KSaverio Dankerwith General Surgery who [facility].  1419: [facility] RLoma Sousa [facility] aware and [facility] patient for transfer. She will notify Dr. OMaxwell Caulwhen the patient arrives via carelink.   Assessment and Plan   A: Perirectal abscess with leukocytosis  P: Pt will [facility] by [facility]  [facility] evaluation from surgeon, Dr. OMaxwell Caul Pt is agreeable with plan.  Pain is controlled.   JLezlie Lye NP 04/13/2015 2:58 PM

## 2015-04-13 NOTE — MAU Note (Signed)
Pt C/O pressure & swelling in perineum since yesterday.   "Feels like I have a razor blade in my stomach, my vagina & my rectum."  States her vulva feels like a skin tag.  Denies bleeding or discharge.

## 2015-04-14 ENCOUNTER — Encounter (HOSPITAL_COMMUNITY): Payer: Self-pay | Admitting: Surgery

## 2015-04-14 LAB — GC/CHLAMYDIA PROBE AMP (~~LOC~~) NOT AT ARMC
CHLAMYDIA, DNA PROBE: NEGATIVE
NEISSERIA GONORRHEA: NEGATIVE

## 2015-04-14 MED ORDER — ONDANSETRON HCL 4 MG PO TABS: 4.0000 mg | ORAL_TABLET | Freq: Three times a day (TID) | ORAL | Status: AC | PRN

## 2015-04-14 MED ORDER — FLUCONAZOLE 100 MG PO TABS: 100.0000 mg | ORAL_TABLET | Freq: Every day | ORAL | Status: AC

## 2015-04-14 MED ORDER — OXYCODONE HCL 5 MG PO TABS: 5.0000 mg | ORAL_TABLET | Freq: Four times a day (QID) | ORAL | Status: AC | PRN

## 2015-04-14 NOTE — Progress Notes (Signed)
Patient alert and orient, pain controlled. Patient given discharge instructions and prescriptions. All questions answered.

## 2015-04-14 NOTE — Discharge Summary (Signed)
Patient ID: Whitney [name] MRN: [id number] DOB/AGE: 41/05/1975 41 y.o.  Admit date: 04/13/2015 Discharge date: 04/14/2015  Procedures: EUA with internal drainage of perineal abscess and excision of thrombosed internal hemorrhoid   Consults: None  Reason for Admission:  This is a 39 yo white female who began having perineal pain a couple of days ago. Within the last day it has progressively worsened. She felt like her pain was initially in her vagina. She felt like she had some vaginal prolapse. She denies any fevers, chills. She has been having normal BMs. She was sitting in her bath tub for the last day and doing multiple sitz bathes. [facility] She had a CT of her pelvis which shows a possible perineal abscess measuring about 1.6cm. [facility]  Admission Diagnoses:  1. Perineal abscess  Hospital Course: The patient was admitted and taken to the OR where she underwent the above procedure.  She tolerated this well.  Her pain was well controlled and she was tolerating a solid diet on POD 1.  She was having minimal to no bleeding from her rectum or drainage.  She was stable for DC home  PE: Rectum: normal appearing external rectum.  No drainage noted.  Discharge Diagnoses:  Principal Problem:   Suprasphincteric rectal abscess s/p I&D 04/13/2015 Active Problems:   Internal thrombosed hemorrhoid s/p excision 04/13/2015   Tobacco abuse   Discharge Medications:   Medication List    TAKE these medications        amoxicillin-clavulanate 875-125 MG tablet  Commonly known as:  AUGMENTIN  Take 1 tablet by mouth 2 (two) times daily.     fluconazole 100 MG tablet  Commonly known as:  DIFLUCAN  Take 1 tablet (100 mg total) by mouth daily.     ibuprofen 800 MG tablet  Commonly known as:  ADVIL,MOTRIN  Take 800 mg by mouth every 8 (eight) hours as needed for moderate pain.     naproxen 500 MG  tablet  Commonly known as:  NAPROSYN  Take 1 tablet (500 mg total) by mouth 2 (two) times daily with a meal.     ondansetron 4 MG tablet  Commonly known as:  ZOFRAN  Take 1 tablet (4 mg total) by mouth every 8 (eight) hours as needed for nausea or vomiting.     OVER THE COUNTER MEDICATION  Take 1 tablet by mouth daily. Patient takes over the counter Vitamin D     oxyCODONE 5 MG immediate release tablet  Commonly known as:  Oxy IR/ROXICODONE  Take 1-2 tablets (5-10 mg total) by mouth every 6 (six) hours as needed for moderate pain, severe pain or breakthrough pain.     vitamin C 500 MG tablet  Commonly known as:  ASCORBIC ACID  Take 500 mg by mouth.        Discharge Instructions:     Follow-up Information    Follow up with GROSS,STEVEN C., MD. Schedule an appointment as soon as possible for a visit in 2 weeks.   Specialty:  General Surgery   Why:  To follow up after your operation, To follow up after your hospital stay   Contact information:   [address] [place] [address] [phone]       Signed: Letha CapeOSBORNE,Jakala Herford E 04/14/2015, 10:54 AM

## 2015-08-14 IMAGING — US US OB COMP LESS 14 WK
1 series · 14 of 28 positions shown · non-contrast
Comparison: None.

CLINICAL DATA: Early pregnancy pain.  Left lower quadrant pain.

EXAM:
OBSTETRIC <14 WK US AND TRANSVAGINAL OB US
TECHNIQUE: Both transabdominal and transvaginal ultrasound examinations were
performed for complete evaluation of the gestation as well as the
maternal uterus, adnexal regions, and pelvic cul-de-sac.
Transvaginal technique was performed to assess early pregnancy.

[Series 1: us ob comp less 14 wks · 14 of 95 slices shown]
[im 4/95]
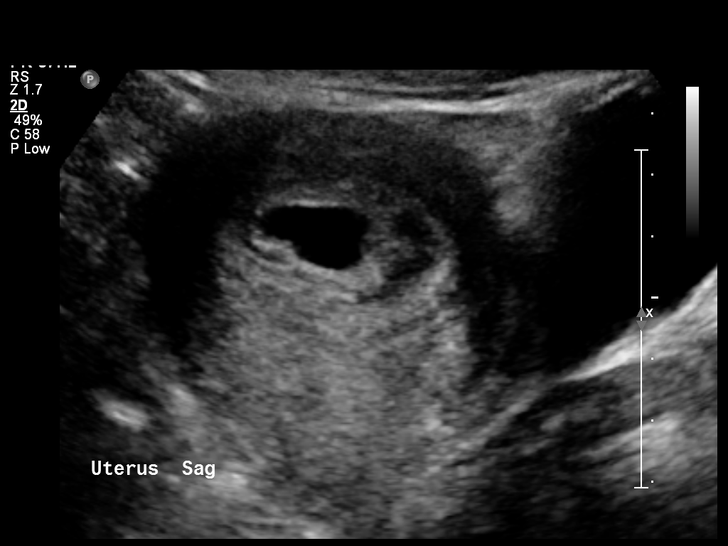
[im 11/95]
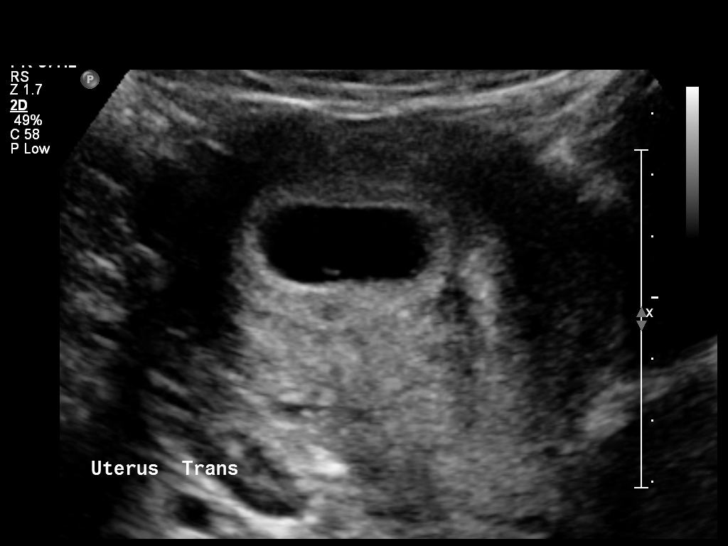
[im 18/95]
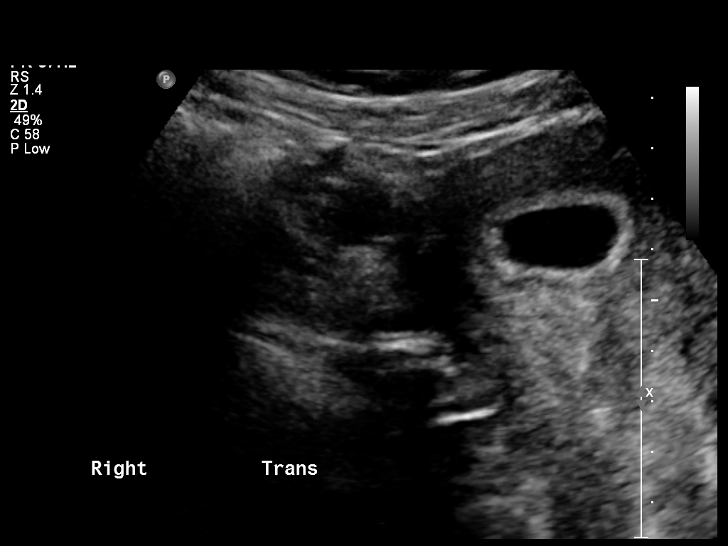
[im 25/95]
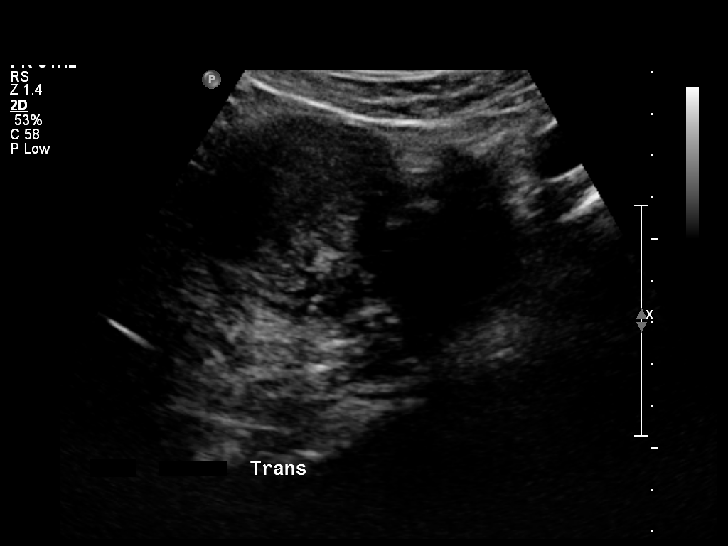
[im 32/95]
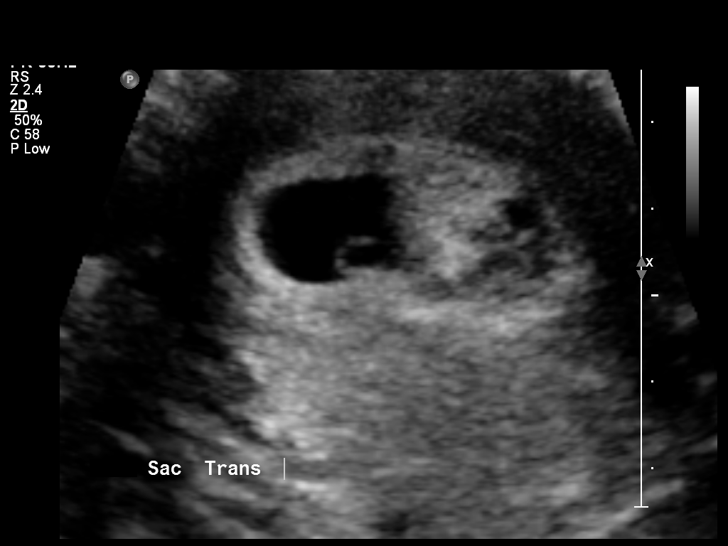
[im 39/95]
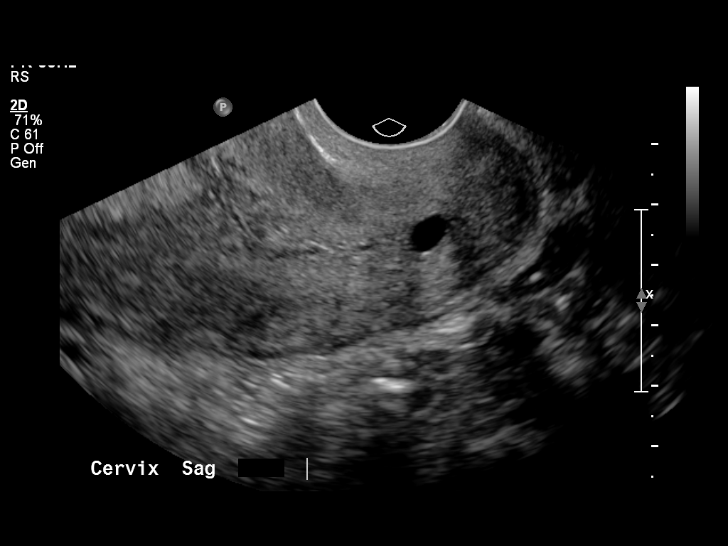
[im 46/95]
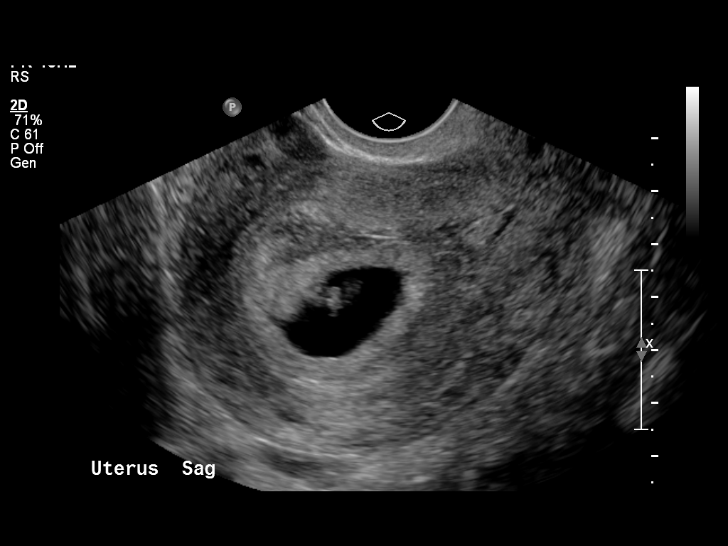
[im 53/95]
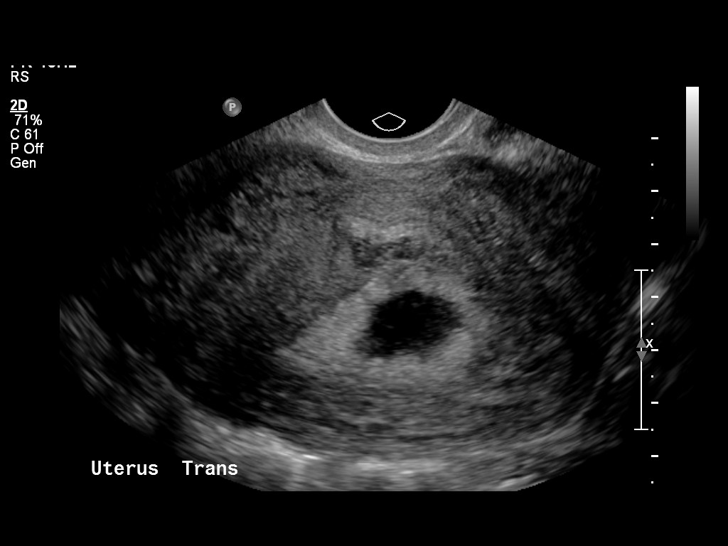
[im 60/95]
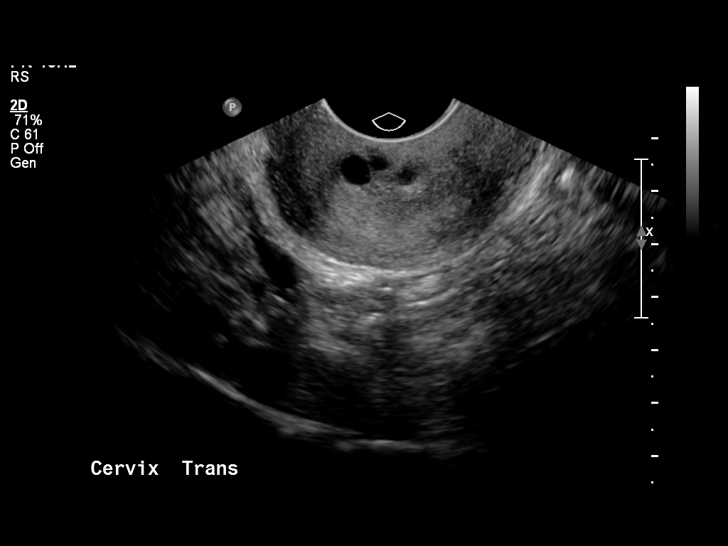
[im 67/95]
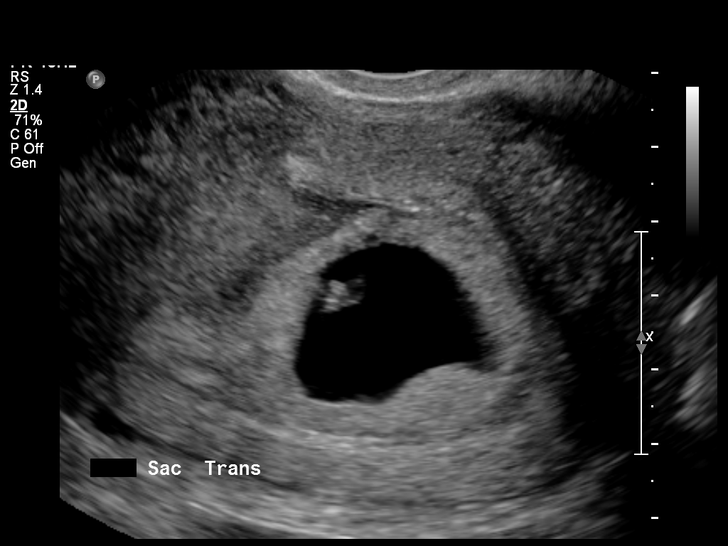
[im 74/95]
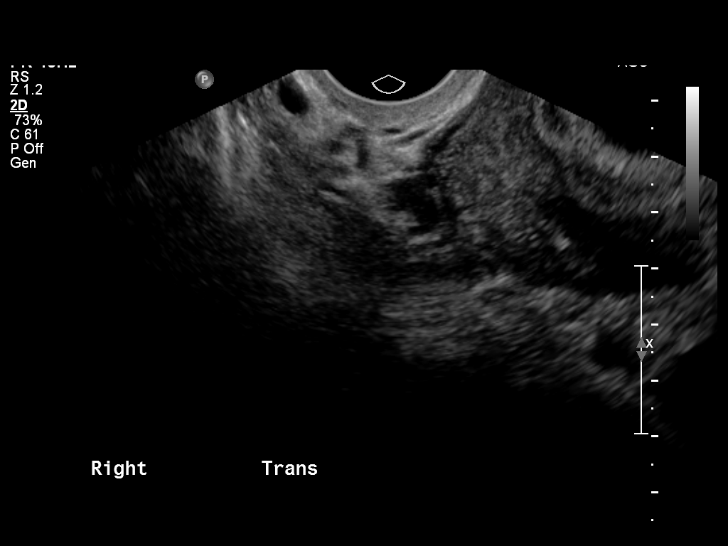
[im 81/95]
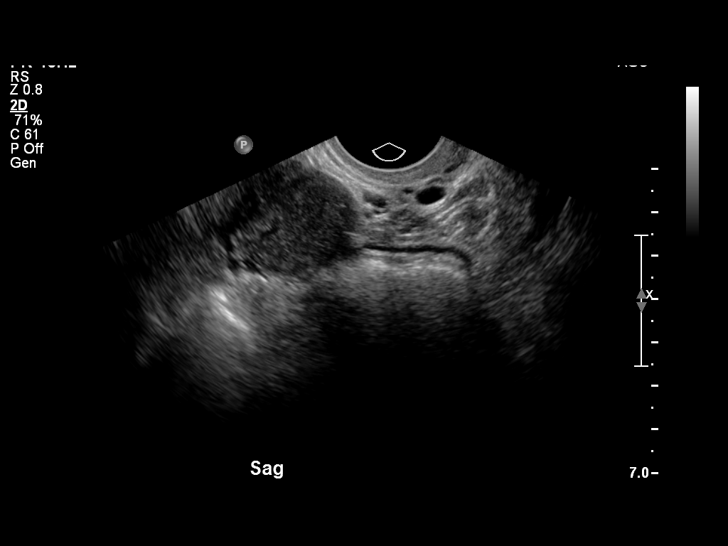
[im 88/95]
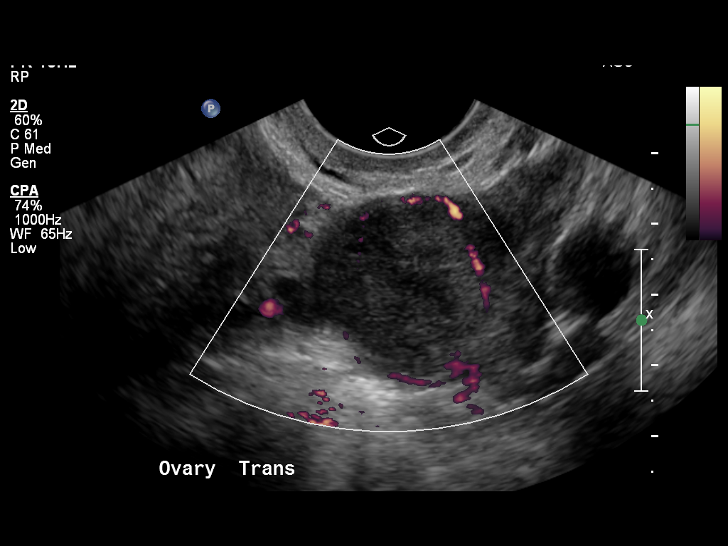
[im 95/95]
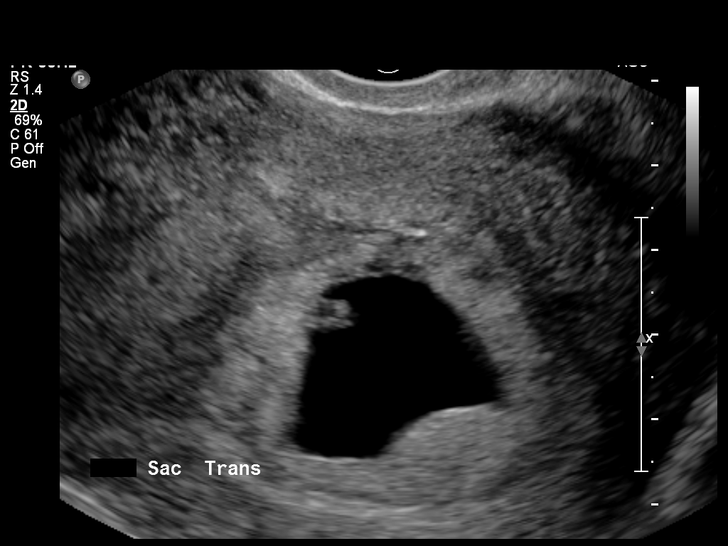

[14 of 28 positions shown; findings below may reference images not displayed]

FINDINGS: Intrauterine gestational sac: Single

Yolk sac:  Yes

Embryo:  Yes

Cardiac Activity: Yes

Heart Rate: 118  bpm

MSD:   mm    w     d

CRL:  6.3  Mm   6 w   4 d                  US EDC: 04/10/2015

Maternal uterus/adnexae:

Subchorionic hemorrhage: Small

Right ovary: Normal

Left ovary: Normal

Other :None

Free fluid:  None
IMPRESSION: 1. Single living intrauterine gestation. The estimated gestational
age is 6 weeks and 4 days.
2. Small subchorionic hemorrhage.

## 2015-08-20 IMAGING — US US OB TRANSVAGINAL
1 series · 14 of 28 positions shown · non-contrast
Comparison: 08/19/2014

CLINICAL DATA: Vaginal bleeding during first trimester. Current
assigned gestational age of 7 weeks 3 days by prior ultrasound.

EXAM:
TRANSVAGINAL OB ULTRASOUND
TECHNIQUE: Transvaginal ultrasound was performed for complete evaluation of the
gestation as well as the maternal uterus, adnexal regions, and
pelvic cul-de-sac.

[Series 1: us ob follow up · 29 acquisitions, 14 frames shown]
[im 2/29]
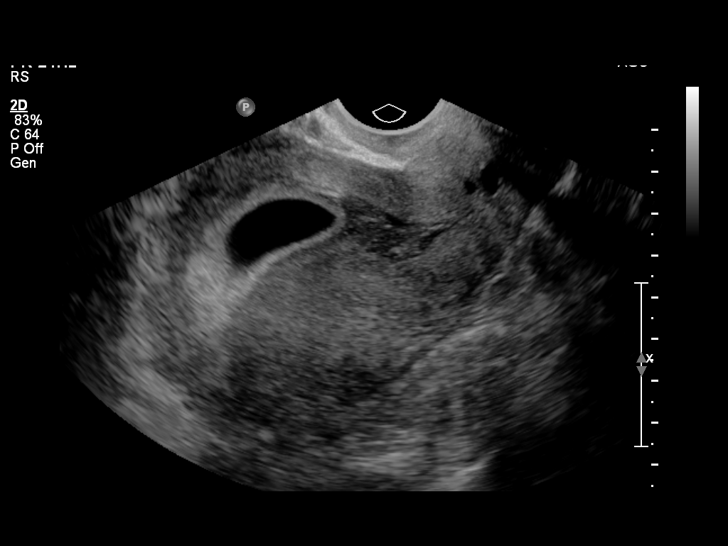
[im 4/29]
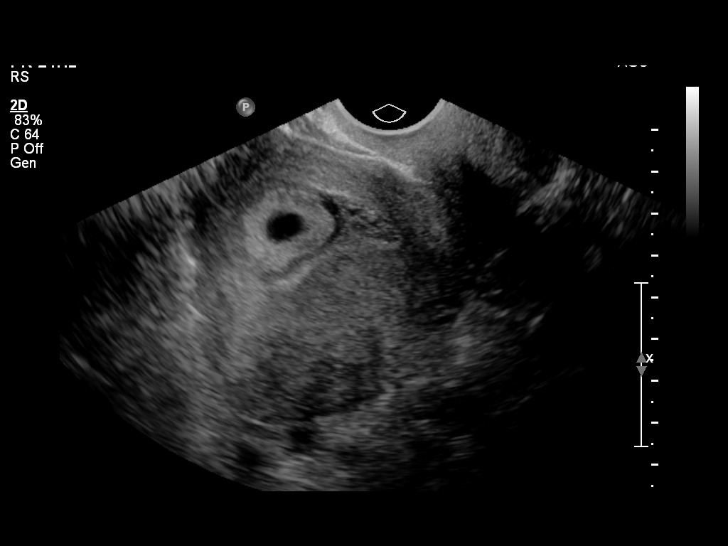
[im 6/29]
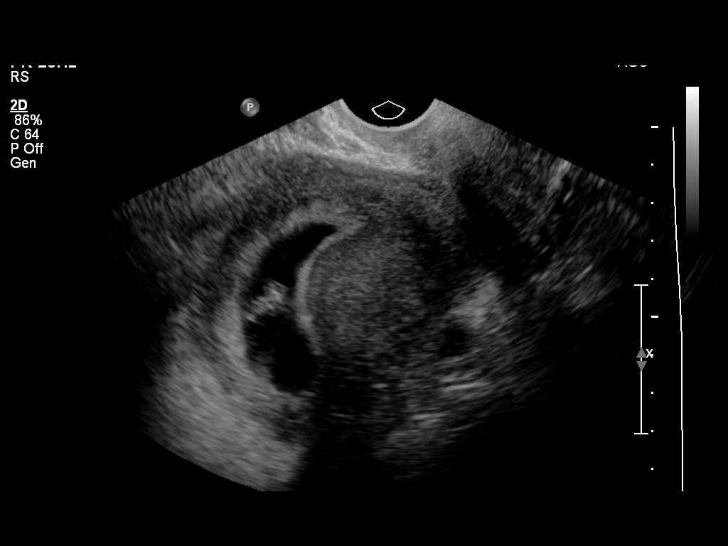
[im 8/29]
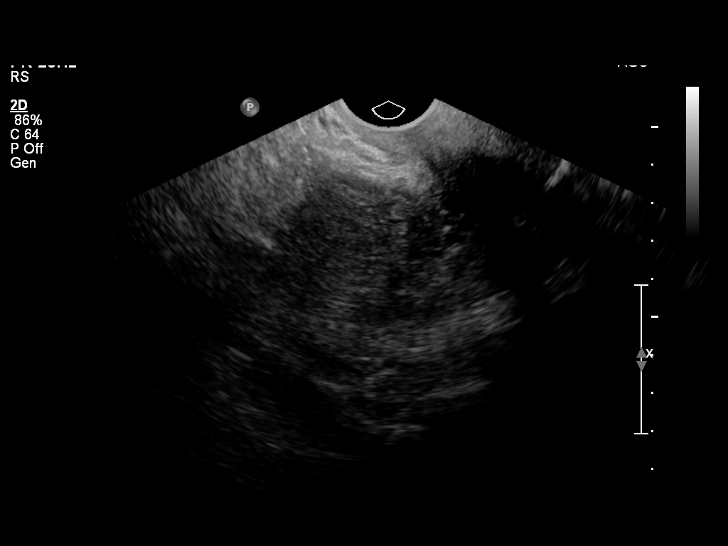
[im 10/29]
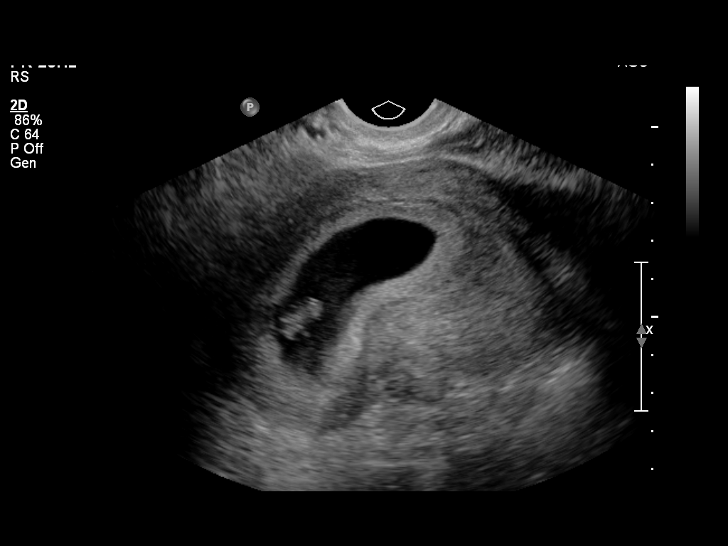
[im 12/29]
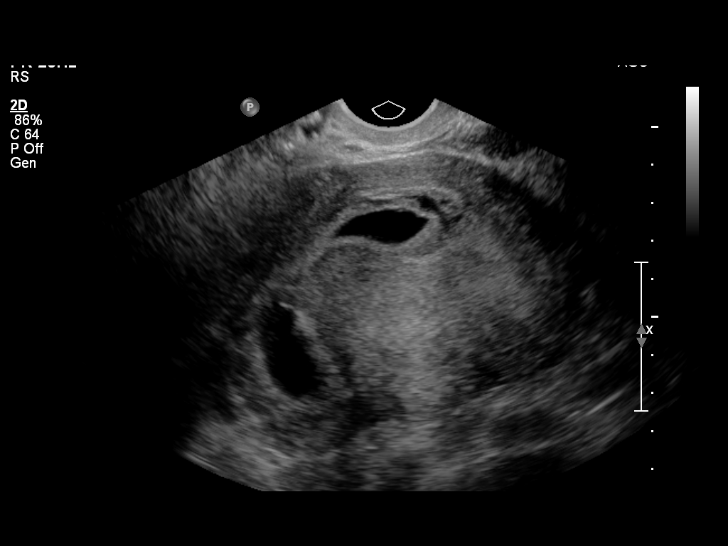
[im 14/29]
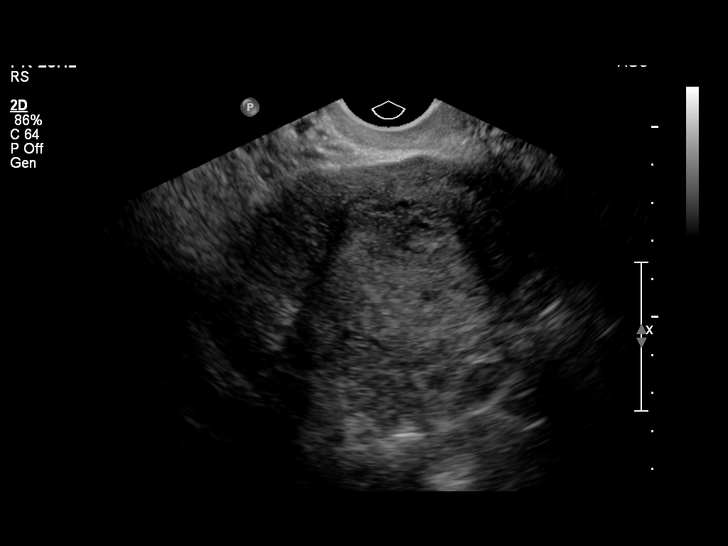
[im 16/29]
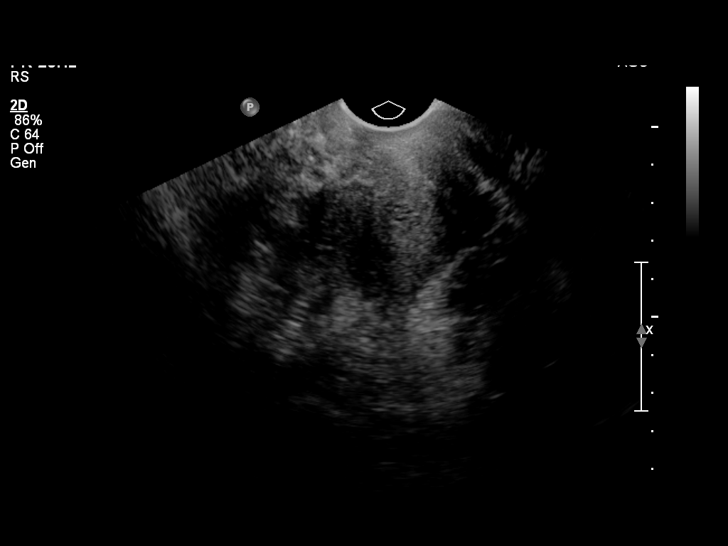
[im 18/29]
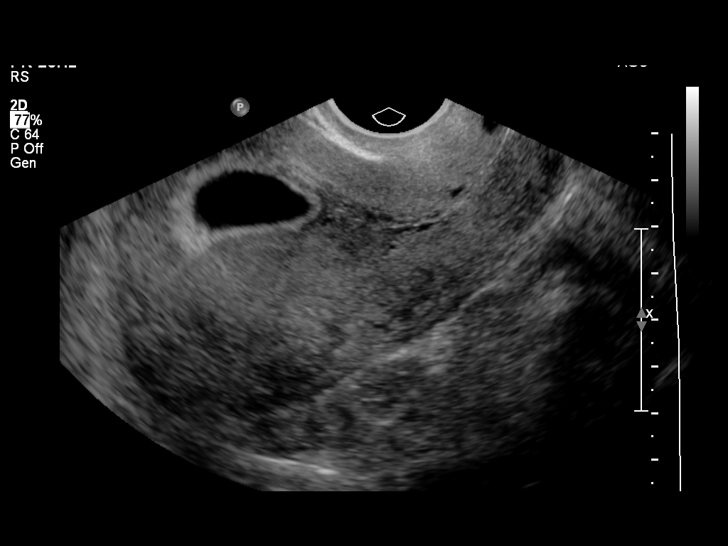
[im 20/29]
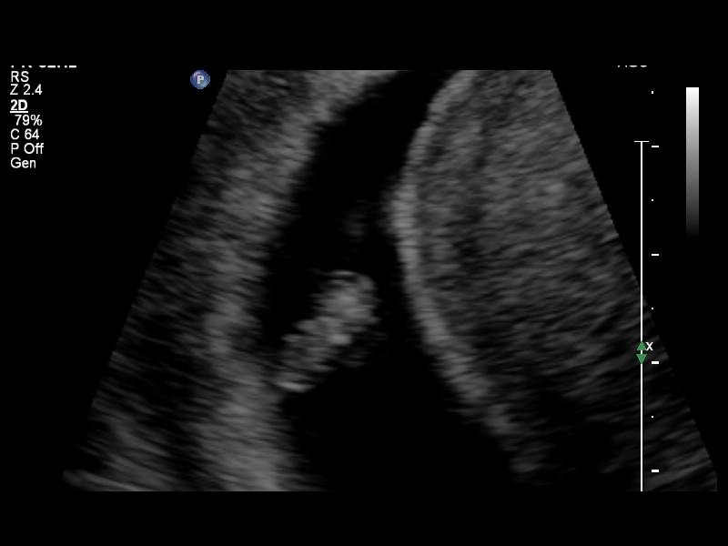
[im 22/29]
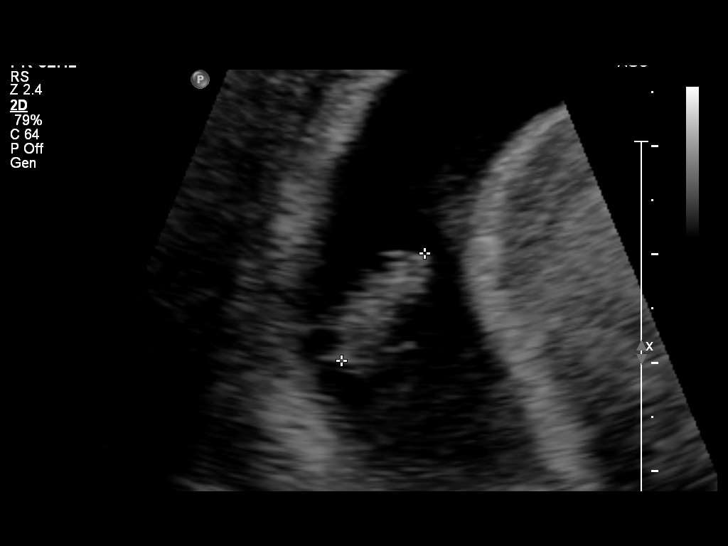
[im 24/29]
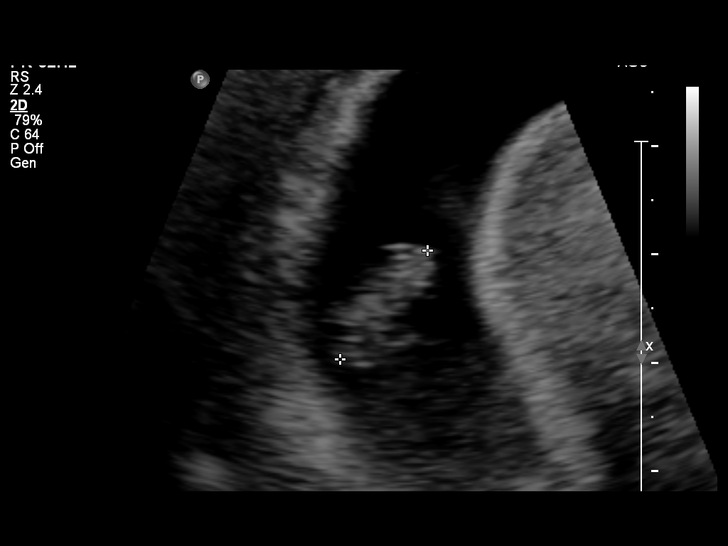
[im 26/29]
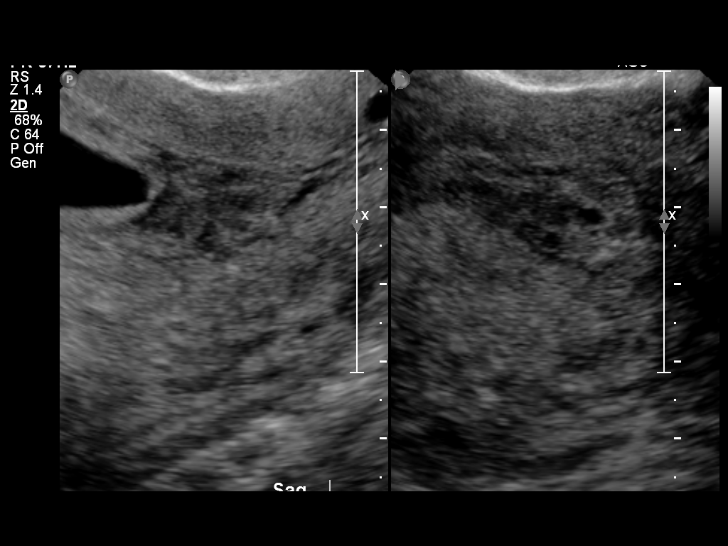
[im 29/29]
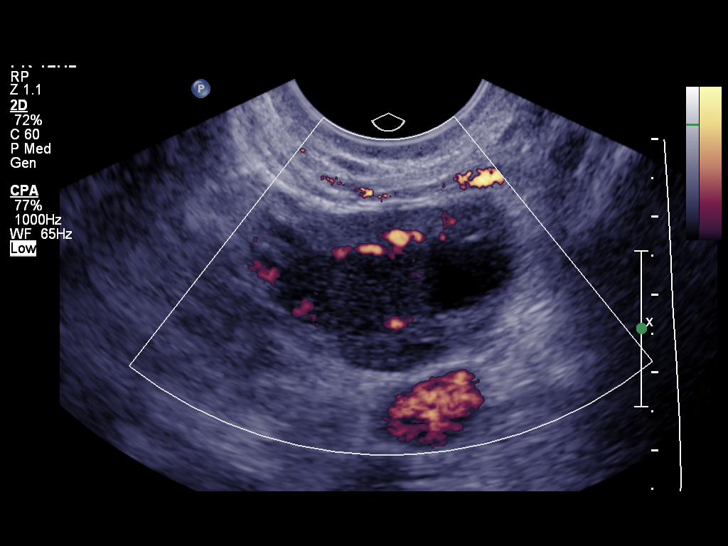

[14 of 28 positions shown; findings below may reference images not displayed]

FINDINGS: Intrauterine gestational sac: Visualized/normal in shape.

Yolk sac:  Visualized

Embryo:  Visualized

Cardiac Activity: Visualized

Heart Rate: 137 bpm

CRL:   13  mm   7 w 4 d                  US EDC: 04/09/2015

Maternal uterus/adnexae: A small subchorionic hemorrhage is again
seen measuring approximately 2.8 x 1.4 x 2.3 cm. Both ovaries are
normal in appearance. No mass or free fluid identified.
IMPRESSION: Single living IUP with assigned gestational age of 7 weeks 3 days by
prior ultrasound. Appropriate fetal growth.

No significant change in small subchorionic hemorrhage. No other
significant abnormality identified.
